# Patient Record
Sex: Male | Born: 1955 | Hispanic: No | Marital: Married | Smoking: Never smoker
Health system: Southern US, Community
[De-identification: ages and names within clinical notes are randomized; demographics above are authoritative.]

## PROBLEM LIST (undated history)

## (undated) DIAGNOSIS — E119 Type 2 diabetes mellitus without complications: Secondary | ICD-10-CM

## (undated) DIAGNOSIS — E785 Hyperlipidemia, unspecified: Secondary | ICD-10-CM

## (undated) DIAGNOSIS — I10 Essential (primary) hypertension: Secondary | ICD-10-CM

## (undated) DIAGNOSIS — I4892 Unspecified atrial flutter: Secondary | ICD-10-CM

## (undated) HISTORY — DX: Type 2 diabetes mellitus without complications: E11.9

## (undated) HISTORY — DX: Hyperlipidemia, unspecified: E78.5

## (undated) HISTORY — DX: Unspecified atrial flutter: I48.92

## (undated) HISTORY — PX: CATARACT EXTRACTION: SUR2

## (undated) HISTORY — DX: Essential (primary) hypertension: I10

---

## 2006-05-26 ENCOUNTER — Ambulatory Visit (HOSPITAL_BASED_OUTPATIENT_CLINIC_OR_DEPARTMENT_OTHER): Admission: RE | Admit: 2006-05-26 | Discharge: 2006-05-26 | Payer: Self-pay | Admitting: Urology

## 2012-04-28 ENCOUNTER — Encounter (HOSPITAL_COMMUNITY): Payer: Self-pay

## 2012-04-28 ENCOUNTER — Emergency Department (HOSPITAL_COMMUNITY)
Admission: EM | Admit: 2012-04-28 | Discharge: 2012-04-28 | Disposition: A | Discharge status: Home / Self Care | Payer: PRIVATE HEALTH INSURANCE | Attending: Emergency Medicine | Admitting: Emergency Medicine

## 2012-04-28 DIAGNOSIS — T63461A Toxic effect of venom of wasps, accidental (unintentional), initial encounter: Secondary | ICD-10-CM | POA: Insufficient documentation

## 2012-04-28 DIAGNOSIS — T6391XA Toxic effect of contact with unspecified venomous animal, accidental (unintentional), initial encounter: Secondary | ICD-10-CM | POA: Insufficient documentation

## 2012-04-28 DIAGNOSIS — T63441A Toxic effect of venom of bees, accidental (unintentional), initial encounter: Secondary | ICD-10-CM

## 2012-04-28 MED ORDER — DIPHENHYDRAMINE HCL 50 MG/ML IJ SOLN
25.0000 mg | Freq: Once | INTRAMUSCULAR | Status: AC
  Administered 2012-04-28: 25 mg via INTRAVENOUS
  Filled 2012-04-28: qty 1

## 2012-04-28 MED ORDER — PREDNISONE 10 MG PO TABS: 40.0000 mg | ORAL_TABLET | Freq: Every day | ORAL | Status: DC

## 2012-04-28 MED ORDER — EPINEPHRINE 0.3 MG/0.3ML IJ DEVI: 0.3000 mg | INTRAMUSCULAR | Status: DC | PRN

## 2012-04-28 MED ORDER — SODIUM CHLORIDE 0.9 % IV SOLN
Freq: Once | INTRAVENOUS | Status: AC
  Administered 2012-04-28: 17:00:00 via INTRAVENOUS

## 2012-04-28 MED ORDER — DEXAMETHASONE SODIUM PHOSPHATE 10 MG/ML IJ SOLN
10.0000 mg | Freq: Once | INTRAMUSCULAR | Status: AC
  Administered 2012-04-28: 10 mg via INTRAVENOUS
  Filled 2012-04-28: qty 1

## 2012-04-28 MED ORDER — FAMOTIDINE IN NACL 20-0.9 MG/50ML-% IV SOLN
20.0000 mg | Freq: Once | INTRAVENOUS | Status: AC
  Administered 2012-04-28: 20 mg via INTRAVENOUS
  Filled 2012-04-28: qty 50

## 2012-04-28 NOTE — ED Notes (Addendum)
In from home states was stung by 4 yellow jackets, presents with redness and swelling to the face, denies difficulty breathing airway intact, rash/welts to hands and legs

## 2012-04-28 NOTE — ED Provider Notes (Signed)
History     CSN: [id number]  Arrival date & time 04/28/12  1604   First MD Initiated Contact with Patient 04/28/12 1619      Chief Complaint  Patient presents with  . Insect Bite    (Consider location/radiation/quality/duration/timing/severity/associated sxs/prior treatment) HPI Comments: Patient here with red itchy rash after having gotten stung by 4 yellow jackets today.  Reports no prior history of reaction to bee stings.  States he has not taken any thing for this - denies fever, chills, throat swelling, tongue swelling, chest pain or shortness of breath.  Patient is a 56 y.o. male presenting with rash. The history is provided by the patient. No language interpreter was used.  Rash  This is a new problem. The current episode started 1 to 2 hours ago. The problem has been gradually worsening. The problem is associated with an insect bite/sting. There has been no fever. The rash is present on the face, lips, trunk, right arm and left arm. The pain is at a severity of 3/10. The pain is moderate. The pain has been constant since onset. Associated symptoms include itching and pain. Pertinent negatives include no blisters and no weeping. He has tried nothing for the symptoms. The treatment provided no relief.    History reviewed. No pertinent past medical history.  History reviewed. No pertinent past surgical history.  No family history on file.  History  Substance Use Topics  . Smoking status: Never Smoker   . Smokeless tobacco: Not on file  . Alcohol Use: No      Review of Systems  Constitutional: Negative for fever and chills.  HENT: Positive for facial swelling. Negative for neck pain.   Eyes: Negative for pain.  Respiratory: Negative for chest tightness and shortness of breath.   Cardiovascular: Negative for chest pain.  Gastrointestinal: Negative for nausea, vomiting and abdominal pain.  Genitourinary: Negative for flank pain.  Musculoskeletal: Negative for back pain.    Skin: Positive for itching and rash.  Neurological: Negative for headaches.  All other systems reviewed and are negative.    Allergies  Bee venom  Home Medications   Current Outpatient Rx  Name Route Sig Dispense Refill  . IBUPROFEN 200 MG PO TABS Oral Take 200 mg by mouth every 6 (six) hours as needed.      BP 125/68  Pulse 73  Temp 98.8 F (37.1 C) (Oral)  Resp 18  SpO2 96%  Physical Exam  Nursing note and vitals reviewed. Constitutional: He is oriented to person, place, and time. He appears well-developed and well-nourished. No distress.  HENT:  Head: Atraumatic.  Right Ear: External ear normal.  Left Ear: External ear normal.  Nose: Nose normal.  Mouth/Throat: Oropharynx is clear and moist. No oropharyngeal exudate.       Face with erythematous rash, mild swelling to lips. Oropharynx clear  Eyes: Conjunctivae are normal. Pupils are equal, round, and reactive to light. No scleral icterus.  Neck: Normal range of motion. Neck supple.  Cardiovascular: Normal rate, regular rhythm and normal heart sounds.  Exam reveals no gallop and no friction rub.   No murmur heard. Pulmonary/Chest: Effort normal and breath sounds normal. No respiratory distress. He has no wheezes. He has no rales. He exhibits no tenderness.  Abdominal: Soft. Bowel sounds are normal. He exhibits no distension. There is no tenderness.  Musculoskeletal: Normal range of motion. He exhibits no edema and no tenderness.  Lymphadenopathy:    He has no cervical adenopathy.  Neurological:  He is alert and oriented to person, place, and time. No cranial nerve deficit. He exhibits normal muscle tone. Coordination normal.  Skin: Skin is warm and dry. Rash noted. There is erythema.       Urticarial erythematous rash to face, neck, torso, bilateral arms and back.  Psychiatric: He has a normal mood and affect. His behavior is normal. Judgment and thought content normal.    ED Course  Procedures (including critical  care time)  Labs Reviewed - No data to display No results found.  Allergic reaction to bee sting    MDM  Patient here with allergic reaction to bee sting - erythema better, no evidence of airway compromise, will discharge home.        Izola Price [place], [place] 04/28/12 1859

## 2012-04-28 NOTE — ED Notes (Signed)
Bed:[facility]ALA<BR> Expected date:<BR> Expected time:<BR> Means of arrival:<BR> Comments:<BR>

## 2012-04-29 NOTE — ED Provider Notes (Signed)
Medical screening examination/treatment/procedure(s) were performed by non-physician practitioner and as supervising physician I was immediately available for consultation/collaboration.  Hurman Horn, MD 04/29/12 1236

## 2014-02-13 ENCOUNTER — Other Ambulatory Visit: Payer: Self-pay | Admitting: Family Medicine

## 2014-02-13 DIAGNOSIS — R0989 Other specified symptoms and signs involving the circulatory and respiratory systems: Secondary | ICD-10-CM

## 2014-02-14 ENCOUNTER — Ambulatory Visit
Admission: RE | Admit: 2014-02-14 | Discharge: 2014-02-14 | Disposition: A | Discharge status: Home / Self Care | Payer: 59 | Source: Ambulatory Visit | Attending: Family Medicine | Admitting: Family Medicine

## 2014-02-14 DIAGNOSIS — R0989 Other specified symptoms and signs involving the circulatory and respiratory systems: Secondary | ICD-10-CM

## 2019-03-20 DIAGNOSIS — E119 Type 2 diabetes mellitus without complications: Secondary | ICD-10-CM | POA: Diagnosis not present

## 2019-03-20 DIAGNOSIS — E7800 Pure hypercholesterolemia, unspecified: Secondary | ICD-10-CM | POA: Diagnosis not present

## 2019-06-28 DIAGNOSIS — Z23 Encounter for immunization: Secondary | ICD-10-CM | POA: Diagnosis not present

## 2019-06-28 DIAGNOSIS — E119 Type 2 diabetes mellitus without complications: Secondary | ICD-10-CM | POA: Diagnosis not present

## 2019-06-28 DIAGNOSIS — E7800 Pure hypercholesterolemia, unspecified: Secondary | ICD-10-CM | POA: Diagnosis not present

## 2019-10-07 DIAGNOSIS — Z0000 Encounter for general adult medical examination without abnormal findings: Secondary | ICD-10-CM | POA: Diagnosis not present

## 2020-01-25 DIAGNOSIS — R0981 Nasal congestion: Secondary | ICD-10-CM | POA: Diagnosis not present

## 2020-01-25 DIAGNOSIS — Z20822 Contact with and (suspected) exposure to covid-19: Secondary | ICD-10-CM | POA: Diagnosis not present

## 2020-01-25 DIAGNOSIS — J309 Allergic rhinitis, unspecified: Secondary | ICD-10-CM | POA: Diagnosis not present

## 2020-10-27 DIAGNOSIS — E7800 Pure hypercholesterolemia, unspecified: Secondary | ICD-10-CM | POA: Diagnosis not present

## 2020-10-27 DIAGNOSIS — Z79899 Other long term (current) drug therapy: Secondary | ICD-10-CM | POA: Diagnosis not present

## 2020-10-27 DIAGNOSIS — Z23 Encounter for immunization: Secondary | ICD-10-CM | POA: Diagnosis not present

## 2020-10-27 DIAGNOSIS — E119 Type 2 diabetes mellitus without complications: Secondary | ICD-10-CM | POA: Diagnosis not present

## 2020-10-27 DIAGNOSIS — Z125 Encounter for screening for malignant neoplasm of prostate: Secondary | ICD-10-CM | POA: Diagnosis not present

## 2020-10-27 DIAGNOSIS — Z0000 Encounter for general adult medical examination without abnormal findings: Secondary | ICD-10-CM | POA: Diagnosis not present

## 2020-11-30 DIAGNOSIS — E1169 Type 2 diabetes mellitus with other specified complication: Secondary | ICD-10-CM | POA: Diagnosis not present

## 2020-11-30 DIAGNOSIS — E7800 Pure hypercholesterolemia, unspecified: Secondary | ICD-10-CM | POA: Diagnosis not present

## 2020-11-30 DIAGNOSIS — I10 Essential (primary) hypertension: Secondary | ICD-10-CM | POA: Diagnosis not present

## 2021-11-01 ENCOUNTER — Other Ambulatory Visit (HOSPITAL_BASED_OUTPATIENT_CLINIC_OR_DEPARTMENT_OTHER): Payer: Self-pay | Admitting: Family Medicine

## 2021-11-01 DIAGNOSIS — M79652 Pain in left thigh: Secondary | ICD-10-CM

## 2021-11-02 ENCOUNTER — Other Ambulatory Visit: Payer: Self-pay

## 2021-11-02 ENCOUNTER — Ambulatory Visit (HOSPITAL_BASED_OUTPATIENT_CLINIC_OR_DEPARTMENT_OTHER)
Admission: RE | Admit: 2021-11-02 | Discharge: 2021-11-02 | Disposition: A | Discharge status: Home / Self Care | Payer: BC Managed Care – PPO | Source: Ambulatory Visit | Attending: Family Medicine | Admitting: Family Medicine

## 2021-11-02 DIAGNOSIS — M79652 Pain in left thigh: Secondary | ICD-10-CM | POA: Insufficient documentation

## 2021-11-09 ENCOUNTER — Other Ambulatory Visit: Payer: Self-pay

## 2021-11-09 ENCOUNTER — Encounter: Payer: Self-pay | Admitting: Cardiology

## 2021-11-09 ENCOUNTER — Ambulatory Visit: Payer: BC Managed Care – PPO | Admitting: Cardiology

## 2021-11-09 VITALS — BP 130/68 | HR 54 | Temp 97.6°F | Resp 16 | Ht 72.0 in | Wt 230.8 lb

## 2021-11-09 DIAGNOSIS — E1169 Type 2 diabetes mellitus with other specified complication: Secondary | ICD-10-CM

## 2021-11-09 DIAGNOSIS — E6609 Other obesity due to excess calories: Secondary | ICD-10-CM

## 2021-11-09 DIAGNOSIS — E1165 Type 2 diabetes mellitus with hyperglycemia: Secondary | ICD-10-CM

## 2021-11-09 DIAGNOSIS — Z6831 Body mass index (BMI) 31.0-31.9, adult: Secondary | ICD-10-CM

## 2021-11-09 DIAGNOSIS — I10 Essential (primary) hypertension: Secondary | ICD-10-CM

## 2021-11-09 DIAGNOSIS — I483 Typical atrial flutter: Secondary | ICD-10-CM

## 2021-11-09 DIAGNOSIS — Z7901 Long term (current) use of anticoagulants: Secondary | ICD-10-CM

## 2021-11-09 DIAGNOSIS — E785 Hyperlipidemia, unspecified: Secondary | ICD-10-CM

## 2021-11-09 NOTE — H&P (View-Only) (Signed)
Date:  11/09/2021   ID:  Jordan Sharp, DOB 02/15/1956, MRN [id number]  PCP:  Lujean Amel, MD  Cardiologist:  Rex Kras, DO, [facility] (established care 11/09/2021)  REASON FOR CONSULT: Atrial Flutter.   REQUESTING PHYSICIAN:  Lujean Amel, MD [place] [place],  [place] [id number]  Chief Complaint  Patient presents with   Atrial Flutter   New Patient (Initial Visit)    HPI  Jordan Sharp is a 66 y.o. Caucasian male who presents to the office with a chief complaint of "newly ." Patient's past medical history and cardiovascular risk factors include: Non-insulin-dependent diabetes mellitus type 2, hypertension, atrial flutter, hypercholesterolemia.   He is referred to the office at the request of Koirala, [place], MD for evaluation of Atrial Flutter.  Patient is accompanied by his wife Jordan Sharp at today's visit.  Approximately 5 weeks ago patient was in his normal state of health jogging every day approximately 5 to 6 miles.  After Thanksgiving he woke up 1 day with a charley horse and in the process injured his left thigh later he was informed that it was a muscle tear.  After coming back to town he followed up with his PCP for an annual visit which was scheduled for January 5/6th of 2023.  During that office visit he was diagnosed with atrial flutter.  He was started on metoprolol for rate control strategy and Eliquis for thromboembolic prophylaxis.  He is now referred to cardiology for further evaluation and management.  Patient states that this is the first time he was diagnosed with atrial flutter.  No prior history.  No history of intracranial or gastrointestinal bleeding.  No history of anemia.  No recent surgeries.  EKG performed at his PCPs office notes atrial flutter with variable conduction.  No family history of premature coronary disease or sudden cardiac death.  FUNCTIONAL STATUS: 5 weeks ago used to jog 5 to 6 miles per day.   Urine  ALLERGIES: Allergies  Allergen Reactions   Bee Venom Hives and Swelling    MEDICATION LIST PRIOR TO VISIT: Current Meds  Medication Sig   atorvastatin (LIPITOR) 10 MG tablet Take 10 mg by mouth daily.   Cholecalciferol (VITAMIN D3) 1.25 MG (50000 UT) CAPS Take by mouth.   ELIQUIS 5 MG TABS tablet Take 5 mg by mouth 2 (two) times daily.   EPINEPHrine (EPIPEN) 0.3 mg/0.3 mL DEVI Inject 0.3 mLs (0.3 mg total) into the muscle as needed.   Ginger, Zingiber officinalis, (GINGER ROOT PO) Take 540 mg by mouth daily at 12 noon.   glipiZIDE (GLUCOTROL XL) 5 MG 24 hr tablet Take 5 mg by mouth every morning.   losartan (COZAAR) 25 MG tablet Take 25 mg by mouth daily.   metFORMIN (GLUCOPHAGE-XR) 500 MG 24 hr tablet Take 500 mg by mouth daily.   Multiple Vitamins-Minerals (MULTIVITAMIN MEN PO) Take 1 tablet by mouth daily.   Omega-3 Fatty Acids (ULTRA OMEGA-3 FISH OIL) 1400 MG CAPS Take by mouth.   OZEMPIC, 0.25 OR 0.5 MG/DOSE, 2 MG/1.5ML SOPN Inject into the skin.   Probiotic Product (UP4 PROBIOTICS PO) Take 2 tablets by mouth 2 (two) times daily.     PAST MEDICAL HISTORY: Past Medical History:  Diagnosis Date   Atrial flutter ([place])    Diabetes ([place])    Hyperlipidemia    Hypertension     PAST SURGICAL HISTORY: History reviewed. No pertinent surgical history.  FAMILY HISTORY: The patient family history includes Diabetes in his brother  and mother. ° °SOCIAL HISTORY:  °The patient  reports that he has never smoked. He does not have any smokeless tobacco history on file. He reports current alcohol use of about 2.0 standard drinks per week. He reports that he does not use drugs. ° °REVIEW OF SYSTEMS: °Review of Systems  °Constitutional: Negative for chills and fever.  °HENT:  Negative for hoarse voice and nosebleeds.   °Eyes:  Negative for discharge, double vision and pain.  °Cardiovascular:  Negative for chest pain, claudication, dyspnea on exertion, leg swelling, near-syncope, orthopnea,  palpitations, paroxysmal nocturnal dyspnea and syncope.  °Respiratory:  Negative for hemoptysis and shortness of breath.   °Musculoskeletal:  Negative for muscle cramps and myalgias.  °     Left thigh pain.  °Gastrointestinal:  Negative for abdominal pain, constipation, diarrhea, hematemesis, hematochezia, melena, nausea and vomiting.  °Neurological:  Negative for dizziness and light-headedness.  ° °PHYSICAL EXAM: °Vitals with BMI 11/09/2021 11/09/2021 04/28/2012  °Height - 6' 0" -  °Weight - 230 lbs 13 oz -  °BMI - 31.3 -  °Systolic 130 136 148  °Diastolic 68 93 67  °Pulse 54 60 71  ° ° °CONSTITUTIONAL: Age-appropriate, well-developed and well-nourished. No acute distress.  °SKIN: Skin is warm and dry. No rash noted. No cyanosis. No pallor. No jaundice °HEAD: Normocephalic and atraumatic.  °EYES: No scleral icterus °MOUTH/THROAT: Moist oral membranes.  °NECK: No JVD present. No thyromegaly noted. No carotid bruits  °LYMPHATIC: No visible cervical adenopathy.  °CHEST Normal respiratory effort. No intercostal retractions  °LUNGS: Clear to auscultation bilaterally.  No stridor. No wheezes. No rales.  °CARDIOVASCULAR: Irregularly irregular, variable S1-S2 no murmurs rubs or gallops appreciated. °ABDOMINAL: Soft, nontender, nondistended, positive bowel sounds in all 4 quadrants no apparent ascites.  °EXTREMITIES: Trace bilateral peripheral edema, warm to touch, 2+ right lower extremity DP and PT pulses, 1+ left lower extremity DP and PT pulses.  Range of motion of the left leg reduced on physical examination. °HEMATOLOGIC: No significant bruising °NEUROLOGIC: Oriented to person, place, and time. Nonfocal. Normal muscle tone.  °PSYCHIATRIC: Normal mood and affect. Normal behavior. Cooperative ° °CARDIAC DATABASE: °EKG: °11/09/2021: Atrial flutter with variable conduction, 82 bpm, consider old inferior infarct.  ° °Echocardiogram: °No results found for this or any previous visit from the past 1095 days. °  ° °Stress  Testing: °No results found for this or any previous visit from the past 1095 days. ° ° °Heart Catheterization: °None ° °Lower extremity venous duplex °11/02/2021 °No evidence of deep venous thrombosis ° °LABORATORY DATA: °No flowsheet data found. ° °No flowsheet data found. ° °Lipid Panel  °No results found for: CHOL, TRIG, HDL, CHOLHDL, VLDL, LDLCALC, LDLDIRECT, LABVLDL ° °No components found for: NTPROBNP °No results for input(s): PROBNP in the last 8760 hours. °No results for input(s): TSH in the last 8760 hours. ° °BMP °No results for input(s): NA, K, CL, CO2, GLUCOSE, BUN, CREATININE, CALCIUM, GFRNONAA, GFRAA in the last 8760 hours. ° °HEMOGLOBIN A1C °No results found for: HGBA1C, MPG ° °External Labs: °Collected: 10/29/2021. °A1c 8.7 °Hemoglobin 15.6 g/dL, hematocrit 45.9% °BUN 17, creatinine 1.05 mg/dL °Sodium 137, potassium 4.7, chloride 99, bicarb 23, °AST 13, ALT 17, alkaline phosphatase 56 °Total cholesterol 186, triglycerides 130, HDL 54, LDL 109, non-HDL 132 ° °IMPRESSION: ° °  ICD-10-CM   °1. Typical atrial flutter (HCC)  I48.3 EKG 12-Lead  °  °2. Long term (current) use of anticoagulants  Z79.01   °  °3. Type 2 diabetes mellitus with hyperglycemia, without   long-term current use of insulin (HCC)  E11.65     4. Type 2 diabetes mellitus with hyperlipidemia (HCC)  E11.69    E78.5     5. Benign hypertension  I10     6. Class 1 obesity due to excess calories with serious comorbidity and body mass index (BMI) of 31.0 to 31.9 in adult  E66.09    Z68.31        RECOMMENDATIONS: Jordan Sharp is a 66 y.o. Caucasian male whose past medical history and cardiac risk factors include: Non-insulin-dependent diabetes mellitus type 2, hypertension, atrial flutter, hypercholesterolemia.   Typical atrial flutter (HCC) Newly discovered, incidentally by primary team Already on metoprolol for rate control strategy and oral anticoagulation for thromboembolic prophylaxis. Recommend either cardioversion or  cardiac electrophysiology consultation for atrial flutter ablation. With regards to cardioversion since he has not been oral anticoagulated for 4 weeks and the duration of atrial flutter is unknown therefore recommend TEE guided cardioversion.  Otherwise would recommend at least 4 weeks of oral anticoagulation uninterrupted prior to considering just direct-current cardioversion (earliest November 29, 2021). The risks, benefits, and alternatives to both transesophageal guided cardioversion and cardioversion alone discussed with the patient and wife at length at today's office visit. For now they would like to hold off on any cardiovascular procedures at this time. They will call the office back regarding their decision. Both patient and wife are educated that if he develops lightheadedness, dizziness, near-syncope or syncopal events or has any rapid ventricular rate he should go to the ED for further evaluation and management. Educated on importance of risk factor modifications: Blood pressure/lipid/diabetes management.   Recommend referral to sleep medicine for sleep apnea-will defer to primary team at this time. Encouraged the importance of increasing physical activity as tolerated with a goal of 30 minutes a day 5 days a week.  Long-term oral anticoagulation: Indication: Atrial flutter CHA2DS2-VASc SCORE is 3 which correlates to 3.2 % risk of stroke per year (DM, HTN, age). Reemphasized the risks, benefits, and alternatives to oral anticoagulation.   We also discussed different anticoagulant candidates such as Coumadin versus direct oral anticoagulants and medication profile.  Shared decision was to continue with Eliquis for now.  Type 2 diabetes mellitus with hyperglycemia, without long-term current use of insulin (HCC) Hemoglobin A1c 8.7 as of October 29, 2021 Currently managed by primary team Reemphasized importance of glycemic control Continue statin with ACEs, ARB.  Type 2 diabetes  mellitus with hyperlipidemia (HCC) Currently on atorvastatin.   He denies myalgia or other side effects. Most recent lipids dated 10/29/2021 reviewed as noted above.  LDL currently not at goal. Currently managed by primary care provider.  Benign hypertension Office blood pressures within acceptable range. Medications reconciled. Reemphasized the importance of low-salt diet.  Class 1 obesity due to excess calories with serious comorbidity and body mass index (BMI) of 31.0 to 31.9 in adult Body mass index is 31.3 kg/m. I reviewed with the patient the importance of diet, regular physical activity/exercise, weight loss.   Patient is educated on increasing physical activity gradually as tolerated.  With the goal of moderate intensity exercise for 30 minutes a day 5 days a week.  FINAL MEDICATION LIST END OF ENCOUNTER: No orders of the defined types were placed in this encounter.   Medications Discontinued During This Encounter  Medication Reason   ibuprofen (ADVIL,MOTRIN) 200 MG tablet    predniSONE (DELTASONE) 10 MG tablet      Current Outpatient Medications:    atorvastatin (  LIPITOR) 10 MG tablet, Take 10 mg by mouth daily., Disp: , Rfl:    Cholecalciferol (VITAMIN D3) 1.25 MG (50000 UT) CAPS, Take by mouth., Disp: , Rfl:    ELIQUIS 5 MG TABS tablet, Take 5 mg by mouth 2 (two) times daily., Disp: , Rfl:    EPINEPHrine (EPIPEN) 0.3 mg/0.3 mL DEVI, Inject 0.3 mLs (0.3 mg total) into the muscle as needed., Disp: 1 Device, Rfl: 0   Ginger, Zingiber officinalis, (GINGER ROOT PO), Take 540 mg by mouth daily at 12 noon., Disp: , Rfl:    glipiZIDE (GLUCOTROL XL) 5 MG 24 hr tablet, Take 5 mg by mouth every morning., Disp: , Rfl:    losartan (COZAAR) 25 MG tablet, Take 25 mg by mouth daily., Disp: , Rfl:    metFORMIN (GLUCOPHAGE-XR) 500 MG 24 hr tablet, Take 500 mg by mouth daily., Disp: , Rfl:    Multiple Vitamins-Minerals (MULTIVITAMIN MEN PO), Take 1 tablet by mouth daily., Disp: , Rfl:     Omega-3 Fatty Acids (ULTRA OMEGA-3 FISH OIL) 1400 MG CAPS, Take by mouth., Disp: , Rfl:    OZEMPIC, 0.25 OR 0.5 MG/DOSE, 2 MG/1.5ML SOPN, Inject into the skin., Disp: , Rfl:    Probiotic Product (UP4 PROBIOTICS PO), Take 2 tablets by mouth 2 (two) times daily., Disp: , Rfl:   Orders Placed This Encounter  Procedures   EKG 12-Lead    There are no Patient Instructions on file for this visit.   --Continue cardiac medications as reconciled in final medication list. --Return in about 4 weeks (around 12/07/2021) for Follow up, Atrial flutter. Or sooner if needed. --Continue follow-up with your primary care physician regarding the management of your other chronic comorbid conditions.  Patient's questions and concerns were addressed to his satisfaction. He voices understanding of the instructions provided during this encounter.   This note was created using a voice recognition software as a result there may be grammatical errors inadvertently enclosed that do not reflect the nature of this encounter. Every attempt is made to correct such errors  Total time spent: 60 minutes as part of this initial consultation reviewed outside records provided by PCP which included office note, EKG, labs these findings have been summarized and noted above for further reference.  Discussed disease management, we emphasized risks/benefits/alternatives to oral anticoagulation, discussed modalities to consider to restore normal sinus rhythm,ordering diagnostic testing, coordination of care and patient education provided as a part of today's encounter.  Rex Kras, [place], [facility]  Pager: [phone] Office: [phone]

## 2021-11-09 NOTE — Progress Notes (Signed)
Date:  11/09/2021   ID:  Jordan Sharp, DOB Jan 30, 1956, MRN [id number]  PCP:  Lujean Amel, MD  Cardiologist:  Rex Kras, DO, [facility] (established care 11/09/2021)  REASON FOR CONSULT: Atrial Flutter.   REQUESTING PHYSICIAN:  Lujean Amel, MD [address],  [place] [address]  Chief Complaint  Patient presents with   Atrial Flutter   New Patient (Initial Visit)    HPI  Jordan Sharp is a 66 y.o. Caucasian male who presents to the office with a chief complaint of "newly ." Patient's past medical history and cardiovascular risk factors include: Non-insulin-dependent diabetes mellitus type 2, hypertension, atrial flutter, hypercholesterolemia.   He is referred to the office at the request of Koirala, Dibas, MD for evaluation of Atrial Flutter.  Patient is accompanied by his wife Beverlee Nims at today's visit.  Approximately 5 weeks ago patient was in his normal state of health jogging every day approximately 5 to 6 miles.  After Thanksgiving he woke up 1 day with a charley horse and in the process injured his left thigh later he was informed that it was a muscle tear.  After coming back to town he followed up with his PCP for an annual visit which was scheduled for January 5/6th of 2023.  During that office visit he was diagnosed with atrial flutter.  He was started on metoprolol for rate control strategy and Eliquis for thromboembolic prophylaxis.  He is now referred to cardiology for further evaluation and management.  Patient states that this is the first time he was diagnosed with atrial flutter.  No prior history.  No history of intracranial or gastrointestinal bleeding.  No history of anemia.  No recent surgeries.  EKG performed at his PCPs office notes atrial flutter with variable conduction.  No family history of premature coronary disease or sudden cardiac death.  FUNCTIONAL STATUS: 5 weeks ago used to jog 5 to 6 miles per day.   Urine  ALLERGIES: Allergies  Allergen Reactions   Bee Venom Hives and Swelling    MEDICATION LIST PRIOR TO VISIT: Current Meds  Medication Sig   atorvastatin (LIPITOR) 10 MG tablet Take 10 mg by mouth daily.   Cholecalciferol (VITAMIN D3) 1.25 MG (50000 UT) CAPS Take by mouth.   ELIQUIS 5 MG TABS tablet Take 5 mg by mouth 2 (two) times daily.   EPINEPHrine (EPIPEN) 0.3 mg/0.3 mL DEVI Inject 0.3 mLs (0.3 mg total) into the muscle as needed.   Ginger, Zingiber officinalis, (GINGER ROOT PO) Take 540 mg by mouth daily at 12 noon.   glipiZIDE (GLUCOTROL XL) 5 MG 24 hr tablet Take 5 mg by mouth every morning.   losartan (COZAAR) 25 MG tablet Take 25 mg by mouth daily.   metFORMIN (GLUCOPHAGE-XR) 500 MG 24 hr tablet Take 500 mg by mouth daily.   Multiple Vitamins-Minerals (MULTIVITAMIN MEN PO) Take 1 tablet by mouth daily.   Omega-3 Fatty Acids (ULTRA OMEGA-3 FISH OIL) 1400 MG CAPS Take by mouth.   OZEMPIC, 0.25 OR 0.5 MG/DOSE, 2 MG/1.5ML SOPN Inject into the skin.   Probiotic Product (UP4 PROBIOTICS PO) Take 2 tablets by mouth 2 (two) times daily.     PAST MEDICAL HISTORY: Past Medical History:  Diagnosis Date   Atrial flutter ([place])    Diabetes ([place])    Hyperlipidemia    Hypertension     PAST SURGICAL HISTORY: History reviewed. No pertinent surgical history.  FAMILY HISTORY: The patient family history includes Diabetes in his brother  and mother.  SOCIAL HISTORY:  The patient  reports that he has never smoked. He does not have any smokeless tobacco history on file. He reports current alcohol use of about 2.0 standard drinks per week. He reports that he does not use drugs.  REVIEW OF SYSTEMS: Review of Systems  Constitutional: Negative for chills and fever.  HENT:  Negative for hoarse voice and nosebleeds.   Eyes:  Negative for discharge, double vision and pain.  Cardiovascular:  Negative for chest pain, claudication, dyspnea on exertion, leg swelling, near-syncope, orthopnea,  palpitations, paroxysmal nocturnal dyspnea and syncope.  Respiratory:  Negative for hemoptysis and shortness of breath.   Musculoskeletal:  Negative for muscle cramps and myalgias.       Left thigh pain.  Gastrointestinal:  Negative for abdominal pain, constipation, diarrhea, hematemesis, hematochezia, melena, nausea and vomiting.  Neurological:  Negative for dizziness and light-headedness.   PHYSICAL EXAM: Vitals with BMI 11/09/2021 11/09/2021 04/28/2012  Height - 6\' 0"  -  Weight - 230 lbs 13 oz -  BMI - 123456 -  Systolic [id number]  Diastolic 68 93 67  Pulse 54 60 71    CONSTITUTIONAL: Age-appropriate, well-developed and well-nourished. No acute distress.  SKIN: Skin is warm and dry. No rash noted. No cyanosis. No pallor. No jaundice HEAD: Normocephalic and atraumatic.  EYES: No scleral icterus MOUTH/THROAT: Moist oral membranes.  NECK: No JVD present. No thyromegaly noted. No carotid bruits  LYMPHATIC: No visible cervical adenopathy.  CHEST Normal respiratory effort. No intercostal retractions  LUNGS: Clear to auscultation bilaterally.  No stridor. No wheezes. No rales.  CARDIOVASCULAR: Irregularly irregular, variable S1-S2 no murmurs rubs or gallops appreciated. ABDOMINAL: Soft, nontender, nondistended, positive bowel sounds in all 4 quadrants no apparent ascites.  EXTREMITIES: Trace bilateral peripheral edema, warm to touch, 2+ right lower extremity DP and PT pulses, 1+ left lower extremity DP and PT pulses.  Range of motion of the left leg reduced on physical examination. HEMATOLOGIC: No significant bruising NEUROLOGIC: Oriented to person, place, and time. Nonfocal. Normal muscle tone.  PSYCHIATRIC: Normal mood and affect. Normal behavior. Cooperative  CARDIAC DATABASE: EKG: 11/09/2021: Atrial flutter with variable conduction, 82 bpm, consider old inferior infarct.   Echocardiogram: No results found for this or any previous visit from the past 1095 days.    Stress  Testing: No results found for this or any previous visit from the past 1095 days.   Heart Catheterization: None  Lower extremity venous duplex 11/02/2021 No evidence of deep venous thrombosis  LABORATORY DATA: No flowsheet data found.  No flowsheet data found.  Lipid Panel  No results found for: CHOL, TRIG, HDL, CHOLHDL, VLDL, LDLCALC, LDLDIRECT, LABVLDL  No components found for: NTPROBNP No results for input(s): PROBNP in the last 8760 hours. No results for input(s): TSH in the last 8760 hours.  BMP No results for input(s): NA, K, CL, CO2, GLUCOSE, BUN, CREATININE, CALCIUM, GFRNONAA, GFRAA in the last 8760 hours.  HEMOGLOBIN A1C No results found for: HGBA1C, MPG  External Labs: Collected: 10/29/2021. A1c 8.7 Hemoglobin 15.6 g/dL, hematocrit 45.9% BUN 17, creatinine 1.05 mg/dL Sodium 137, potassium 4.7, chloride 99, bicarb 23, AST 13, ALT 17, alkaline phosphatase 56 Total cholesterol 186, triglycerides 130, HDL 54, LDL 109, non-HDL 132  IMPRESSION:    ICD-10-CM   1. Typical atrial flutter (HCC)  I48.3 EKG 12-Lead    2. Long term (current) use of anticoagulants  Z79.01     3. Type 2 diabetes mellitus with hyperglycemia, without  long-term current use of insulin (HCC)  E11.65     4. Type 2 diabetes mellitus with hyperlipidemia (HCC)  E11.69    E78.5     5. Benign hypertension  I10     6. Class 1 obesity due to excess calories with serious comorbidity and body mass index (BMI) of 31.0 to 31.9 in adult  E66.09    Z68.31        RECOMMENDATIONS: Muhanad Bjornson is a 66 y.o. Caucasian male whose past medical history and cardiac risk factors include: Non-insulin-dependent diabetes mellitus type 2, hypertension, atrial flutter, hypercholesterolemia.   Typical atrial flutter (HCC) Newly discovered, incidentally by primary team Already on metoprolol for rate control strategy and oral anticoagulation for thromboembolic prophylaxis. Recommend either cardioversion or  cardiac electrophysiology consultation for atrial flutter ablation. With regards to cardioversion since he has not been oral anticoagulated for 4 weeks and the duration of atrial flutter is unknown therefore recommend TEE guided cardioversion.  Otherwise would recommend at least 4 weeks of oral anticoagulation uninterrupted prior to considering just direct-current cardioversion (earliest November 29, 2021). The risks, benefits, and alternatives to both transesophageal guided cardioversion and cardioversion alone discussed with the patient and wife at length at today's office visit. For now they would like to hold off on any cardiovascular procedures at this time. They will call the office back regarding their decision. Both patient and wife are educated that if he develops lightheadedness, dizziness, near-syncope or syncopal events or has any rapid ventricular rate he should go to the ED for further evaluation and management. Educated on importance of risk factor modifications: Blood pressure/lipid/diabetes management.   Recommend referral to sleep medicine for sleep apnea-will defer to primary team at this time. Encouraged the importance of increasing physical activity as tolerated with a goal of 30 minutes a day 5 days a week.  Long-term oral anticoagulation: Indication: Atrial flutter CHA2DS2-VASc SCORE is 3 which correlates to 3.2 % risk of stroke per year (DM, HTN, age). Reemphasized the risks, benefits, and alternatives to oral anticoagulation.   We also discussed different anticoagulant candidates such as Coumadin versus direct oral anticoagulants and medication profile.  Shared decision was to continue with Eliquis for now.  Type 2 diabetes mellitus with hyperglycemia, without long-term current use of insulin (HCC) Hemoglobin A1c 8.7 as of October 29, 2021 Currently managed by primary team Reemphasized importance of glycemic control Continue statin with ACEs, ARB.  Type 2 diabetes  mellitus with hyperlipidemia (HCC) Currently on atorvastatin.   He denies myalgia or other side effects. Most recent lipids dated 10/29/2021 reviewed as noted above.  LDL currently not at goal. Currently managed by primary care provider.  Benign hypertension Office blood pressures within acceptable range. Medications reconciled. Reemphasized the importance of low-salt diet.  Class 1 obesity due to excess calories with serious comorbidity and body mass index (BMI) of 31.0 to 31.9 in adult Body mass index is 31.3 kg/m. I reviewed with the patient the importance of diet, regular physical activity/exercise, weight loss.   Patient is educated on increasing physical activity gradually as tolerated.  With the goal of moderate intensity exercise for 30 minutes a day 5 days a week.  FINAL MEDICATION LIST END OF ENCOUNTER: No orders of the defined types were placed in this encounter.   Medications Discontinued During This Encounter  Medication Reason   ibuprofen (ADVIL,MOTRIN) 200 MG tablet    predniSONE (DELTASONE) 10 MG tablet      Current Outpatient Medications:    atorvastatin (  LIPITOR) 10 MG tablet, Take 10 mg by mouth daily., Disp: , Rfl:    Cholecalciferol (VITAMIN D3) 1.25 MG (50000 UT) CAPS, Take by mouth., Disp: , Rfl:    ELIQUIS 5 MG TABS tablet, Take 5 mg by mouth 2 (two) times daily., Disp: , Rfl:    EPINEPHrine (EPIPEN) 0.3 mg/0.3 mL DEVI, Inject 0.3 mLs (0.3 mg total) into the muscle as needed., Disp: 1 Device, Rfl: 0   Ginger, Zingiber officinalis, (GINGER ROOT PO), Take 540 mg by mouth daily at 12 noon., Disp: , Rfl:    glipiZIDE (GLUCOTROL XL) 5 MG 24 hr tablet, Take 5 mg by mouth every morning., Disp: , Rfl:    losartan (COZAAR) 25 MG tablet, Take 25 mg by mouth daily., Disp: , Rfl:    metFORMIN (GLUCOPHAGE-XR) 500 MG 24 hr tablet, Take 500 mg by mouth daily., Disp: , Rfl:    Multiple Vitamins-Minerals (MULTIVITAMIN MEN PO), Take 1 tablet by mouth daily., Disp: , Rfl:     Omega-3 Fatty Acids (ULTRA OMEGA-3 FISH OIL) 1400 MG CAPS, Take by mouth., Disp: , Rfl:    OZEMPIC, 0.25 OR 0.5 MG/DOSE, 2 MG/1.5ML SOPN, Inject into the skin., Disp: , Rfl:    Probiotic Product (UP4 PROBIOTICS PO), Take 2 tablets by mouth 2 (two) times daily., Disp: , Rfl:   Orders Placed This Encounter  Procedures   EKG 12-Lead    There are no Patient Instructions on file for this visit.   --Continue cardiac medications as reconciled in final medication list. --Return in about 4 weeks (around 12/07/2021) for Follow up, Atrial flutter. Or sooner if needed. --Continue follow-up with your primary care physician regarding the management of your other chronic comorbid conditions.  Patient's questions and concerns were addressed to his satisfaction. He voices understanding of the instructions provided during this encounter.   This note was created using a voice recognition software as a result there may be grammatical errors inadvertently enclosed that do not reflect the nature of this encounter. Every attempt is made to correct such errors  Total time spent: 60 minutes as part of this initial consultation reviewed outside records provided by PCP which included office note, EKG, labs these findings have been summarized and noted above for further reference.  Discussed disease management, we emphasized risks/benefits/alternatives to oral anticoagulation, discussed modalities to consider to restore normal sinus rhythm,ordering diagnostic testing, coordination of care and patient education provided as a part of today's encounter.  Rex Kras, [place], [facility]  Pager: [phone] Office: [phone]

## 2021-11-15 ENCOUNTER — Ambulatory Visit: Payer: BC Managed Care – PPO | Admitting: Cardiology

## 2021-11-16 ENCOUNTER — Other Ambulatory Visit: Payer: Self-pay | Admitting: Cardiology

## 2021-11-16 DIAGNOSIS — Z7901 Long term (current) use of anticoagulants: Secondary | ICD-10-CM

## 2021-11-16 DIAGNOSIS — I483 Typical atrial flutter: Secondary | ICD-10-CM

## 2021-11-16 NOTE — Progress Notes (Signed)
Spoke to the patient on the phone.  Patient is agreeable to proceed with transesophageal guided direct-current cardioversion  His questions and concerns addressed to his satisfaction.  Will check CBC and BMP prior to his procedure.  Tessa Lerner, [place], [facility]  Pager: [phone] Office: [phone]

## 2021-11-22 ENCOUNTER — Encounter (HOSPITAL_COMMUNITY): Payer: Self-pay | Admitting: Cardiology

## 2021-11-28 NOTE — Progress Notes (Signed)
External Labs: Collected: 10/29/2021 provided by PCP A1c 8.7. Hemoglobin 15.6 g/dL, hematocrit 45.9% BUN 17, creatinine 1.05 mg/dL. Sodium 137, potassium 4.7, chloride 99, bicarb 23. AST 13, ALT 17, alkaline phosphatase 56 Total cholesterol 196, triglycerides 130, HDL 54, LDL 109 non-HDL 132

## 2021-11-29 LAB — CBC
Hematocrit: 43.3 % (ref 37.5–51.0)
Hemoglobin: 15 g/dL (ref 13.0–17.7)
MCH: 30.1 pg (ref 26.6–33.0)
MCHC: 34.6 g/dL (ref 31.5–35.7)
MCV: 87 fL (ref 79–97)
Platelets: 228 10*3/uL (ref 150–450)
RBC: 4.99 x10E6/uL (ref 4.14–5.80)
RDW: 13 % (ref 11.6–15.4)
WBC: 5.4 10*3/uL (ref 3.4–10.8)

## 2021-11-29 NOTE — Anesthesia Preprocedure Evaluation (Addendum)
Anesthesia Evaluation  Patient identified by MRN, date of birth, ID band Patient awake    Reviewed: Allergy & Precautions, NPO status , Patient's Chart, lab work & pertinent test results  History of Anesthesia Complications Negative for: history of anesthetic complications  Airway Mallampati: II  TM Distance: >3 FB Neck ROM: Full    Dental no notable dental hx. (+) Dental Advisory Given   Pulmonary neg pulmonary ROS,    Pulmonary exam normal        Cardiovascular hypertension, Pt. on home beta blockers Normal cardiovascular exam+ dysrhythmias Atrial Fibrillation      Neuro/Psych negative neurological ROS     GI/Hepatic negative GI ROS, Neg liver ROS,   Endo/Other  diabetes  Renal/GU negative Renal ROS     Musculoskeletal negative musculoskeletal ROS (+)   Abdominal   Peds  Hematology negative hematology ROS (+)   Anesthesia Other Findings   Reproductive/Obstetrics                            Anesthesia Physical Anesthesia Plan  ASA: 3  Anesthesia Plan: General   Post-op Pain Management:    Induction:   PONV Risk Score and Plan: 2  Airway Management Planned: Mask  Additional Equipment:   Intra-op Plan:   Post-operative Plan:   Informed Consent: I have reviewed the patients History and Physical, chart, labs and discussed the procedure including the risks, benefits and alternatives for the proposed anesthesia with the patient or authorized representative who has indicated his/her understanding and acceptance.     Dental advisory given  Plan Discussed with: Anesthesiologist and CRNA  Anesthesia Plan Comments:        Anesthesia Quick Evaluation

## 2021-11-30 ENCOUNTER — Ambulatory Visit (HOSPITAL_COMMUNITY): Payer: BC Managed Care – PPO | Admitting: Anesthesiology

## 2021-11-30 ENCOUNTER — Ambulatory Visit (HOSPITAL_COMMUNITY): Payer: BC Managed Care – PPO

## 2021-11-30 ENCOUNTER — Encounter (HOSPITAL_COMMUNITY)
Admission: RE | Disposition: A | Discharge status: Home / Self Care | Payer: Self-pay | Source: Home / Self Care | Attending: Cardiology

## 2021-11-30 ENCOUNTER — Other Ambulatory Visit: Payer: Self-pay

## 2021-11-30 ENCOUNTER — Ambulatory Visit (HOSPITAL_COMMUNITY)
Admission: RE | Admit: 2021-11-30 | Discharge: 2021-11-30 | Disposition: A | Discharge status: Home / Self Care | Payer: BC Managed Care – PPO | Attending: Cardiology | Admitting: Cardiology

## 2021-11-30 ENCOUNTER — Encounter (HOSPITAL_COMMUNITY): Payer: Self-pay | Admitting: Cardiology

## 2021-11-30 DIAGNOSIS — I483 Typical atrial flutter: Secondary | ICD-10-CM | POA: Diagnosis not present

## 2021-11-30 DIAGNOSIS — E785 Hyperlipidemia, unspecified: Secondary | ICD-10-CM | POA: Diagnosis not present

## 2021-11-30 DIAGNOSIS — Z7985 Long-term (current) use of injectable non-insulin antidiabetic drugs: Secondary | ICD-10-CM | POA: Diagnosis not present

## 2021-11-30 DIAGNOSIS — Z7901 Long term (current) use of anticoagulants: Secondary | ICD-10-CM | POA: Diagnosis not present

## 2021-11-30 DIAGNOSIS — E1169 Type 2 diabetes mellitus with other specified complication: Secondary | ICD-10-CM | POA: Diagnosis not present

## 2021-11-30 DIAGNOSIS — Z6831 Body mass index (BMI) 31.0-31.9, adult: Secondary | ICD-10-CM | POA: Diagnosis not present

## 2021-11-30 DIAGNOSIS — Z7984 Long term (current) use of oral hypoglycemic drugs: Secondary | ICD-10-CM | POA: Insufficient documentation

## 2021-11-30 DIAGNOSIS — E6609 Other obesity due to excess calories: Secondary | ICD-10-CM | POA: Diagnosis not present

## 2021-11-30 DIAGNOSIS — I3139 Other pericardial effusion (noninflammatory): Secondary | ICD-10-CM | POA: Insufficient documentation

## 2021-11-30 DIAGNOSIS — I10 Essential (primary) hypertension: Secondary | ICD-10-CM | POA: Diagnosis not present

## 2021-11-30 HISTORY — PX: CARDIOVERSION: SHX1299

## 2021-11-30 HISTORY — PX: TEE WITHOUT CARDIOVERSION: SHX5443

## 2021-11-30 HISTORY — PX: BUBBLE STUDY: SHX6837

## 2021-11-30 LAB — BASIC METABOLIC PANEL
BUN/Creatinine Ratio: 13 (ref 10–24)
BUN: 15 mg/dL (ref 8–27)
CO2: 21 mmol/L (ref 20–29)
Calcium: 9.3 mg/dL (ref 8.6–10.2)
Chloride: 104 mmol/L (ref 96–106)
Creatinine, Ser: 1.16 mg/dL (ref 0.76–1.27)
Glucose: 147 mg/dL — ABNORMAL HIGH (ref 70–99)
Potassium: 4.3 mmol/L (ref 3.5–5.2)
Sodium: 142 mmol/L (ref 134–144)
eGFR: 69 mL/min/{1.73_m2} (ref >59)

## 2021-11-30 LAB — GLUCOSE, CAPILLARY: Glucose-Capillary: 104 mg/dL — ABNORMAL HIGH (ref 70–99)

## 2021-11-30 SURGERY — ECHOCARDIOGRAM, TRANSESOPHAGEAL
Anesthesia: General

## 2021-11-30 MED ORDER — SODIUM CHLORIDE 0.9 % IV SOLN: INTRAVENOUS | Status: DC

## 2021-11-30 MED ORDER — PROPOFOL 500 MG/50ML IV EMUL
INTRAVENOUS | Status: DC | PRN
  Administered 2021-11-30: 125 ug/kg/min via INTRAVENOUS

## 2021-11-30 MED ORDER — METOPROLOL TARTRATE 50 MG PO TABS
50.0000 mg | ORAL_TABLET | ORAL | Status: AC
  Administered 2021-11-30: 50 mg via ORAL
  Filled 2021-11-30: qty 1

## 2021-11-30 MED ORDER — LIDOCAINE 2% (20 MG/ML) 5 ML SYRINGE
INTRAMUSCULAR | Status: DC | PRN
Start: 2021-11-30 — End: 2021-11-30
  Administered 2021-11-30: 100 mg via INTRAVENOUS

## 2021-11-30 MED ORDER — METOPROLOL TARTRATE 50 MG PO TABS: 50.0000 mg | ORAL_TABLET | Freq: Two times a day (BID) | ORAL | 0 refills | Status: DC

## 2021-11-30 MED ORDER — ESMOLOL HCL 100 MG/10ML IV SOLN
INTRAVENOUS | Status: DC | PRN
  Administered 2021-11-30: 40 mg via INTRAVENOUS
  Administered 2021-11-30 (×2): 30 mg via INTRAVENOUS

## 2021-11-30 MED ORDER — PROPOFOL 10 MG/ML IV BOLUS
INTRAVENOUS | Status: DC | PRN
Start: 2021-11-30 — End: 2021-11-30
  Administered 2021-11-30: 40 mg via INTRAVENOUS
  Administered 2021-11-30: 20 mg via INTRAVENOUS

## 2021-11-30 NOTE — CV Procedure (Signed)
Transesophageal echocardiogram (TEE) : Preliminary report  Sedation: See anesthesia records.   TEE was performed without complications   LV: Normal EF. RV: Normal structure and function. LA: Grossly normal.  Spontaneous echo contrast was present.  No thrombus present. Left atrial appendage: Spontaneous echo contrast was not present.  No thrombus present. Inter atrial septum is intact without defect. Double contrast study negative for atrial level shunting. RA: Grossly normal.   MV: mild regurgitation,no stenosis, no vegetation noted.  TV: mild regurgitation,no stenosis, no vegetation noted. AV: no regurgitation,no stenosis, no vegetation noted.   PV: no regurgitation,no stenosis, no vegetation noted.   Thoracic and ascending aorta: Mild plaque.   Final report forthcoming.  Delilah Shan [facility]  Pager: [phone] Office: [phone]  Direct current cardioversion:  Indication symptomatic: Symptomatic atrial flutter.  Procedure Details:  Consent: Risks of procedure as well as the alternatives and risks of each were explained to the (patient/caregiver).  Consent for procedure obtained.  Time Out: Verified patient identification, verified procedure, site/side was marked, verified correct patient position, special equipment/implants available, medications/allergies/relevent history reviewed, required imaging and test results available. PERFORMED.  Patient placed on cardiac monitor, pulse oximetry, supplemental oxygen as necessary.  Sedation given:  see anesthesia records. Pacer pads placed anterior and posterior chest.  Cardioverted 1 time(s).  Cardioversion with synchronized biphasic 150J shock.  Evaluation: Findings: Post procedure EKG shows:  Sinus Tachycardia Complications: None Patient did tolerate procedure well.  Wife updated after the procedure.  Increase home dose of lopressor 50mg  po bid w/ holding parameters.  Outpatient follow up 12/16/2021  12/18/2021 [facility]  Pager: [phone] Office: [phone] 11/30/2021, 8:15 AM

## 2021-11-30 NOTE — Anesthesia Postprocedure Evaluation (Addendum)
Anesthesia Post Note  Patient: Jordan Sharp  Procedure(s) Performed: TRANSESOPHAGEAL ECHOCARDIOGRAM (TEE) CARDIOVERSION BUBBLE STUDY     Patient location during evaluation: Endoscopy Anesthesia Type: MAC Level of consciousness: awake and alert Pain management: pain level controlled Vital Signs Assessment: post-procedure vital signs reviewed and stable Respiratory status: spontaneous breathing and respiratory function stable Cardiovascular status: stable Postop Assessment: no apparent nausea or vomiting Anesthetic complications: no   No notable events documented.  Last Vitals:  Vitals:   11/30/21 0820 11/30/21 0836  BP:  119/83  Pulse:  100  Resp:  15  Temp: 36.7 C   SpO2:  99%    Last Pain:  Vitals:   11/30/21 0820  TempSrc:   PainSc: 0-No pain                 Levii Hairfield DANIEL

## 2021-11-30 NOTE — Discharge Instructions (Addendum)
Jordan Sharp has taken his initial dose for today of Metoprolol / Lopressor 50mg  while in PACU ---- do not repeat!

## 2021-11-30 NOTE — Addendum Note (Signed)
Addendum  created 11/30/21 1216 by Jeromiah Ohalloran, MD  ° Clinical Note Signed  °  °

## 2021-11-30 NOTE — Interval H&P Note (Signed)
History and Physical Interval Note:  11/30/2021 7:28 AM  Jordan Sharp  has presented today for surgery, with the diagnosis of AFLUTTER.  The various methods of treatment have been discussed with the patient and family. After consideration of risks, benefits and other options for treatment, the patient has consented to  Procedure(s): TRANSESOPHAGEAL ECHOCARDIOGRAM (TEE) (N/A) CARDIOVERSION (N/A) as a surgical intervention.  The patient's history has been reviewed, patient examined, no change in status, stable for surgery.  I have reviewed the patient's chart and labs.  Questions were answered to the patient's satisfaction.     After careful review of history and examination, the risks, benefits of transesophageal echocardiogram, and alternatives have been explained to the patient. Complications include but not limited to esophageal perforation (rare), gastrointestinal bleeding (rare), cardiac arrhythmia which can include cardiac arrest and death (rare), pharyngeal irritation / discomfort with swallowing / hematoma, methemoglobinemia, bronchospasm, transient hypoxia, nonsustained ventricular tachycardia, transient atrial fibrillation, minimal hemoptysis, vomiting, hypotension, respiratory compromise, reaction to medications, unavoidable damage to teeth and gums, aspiration pneumonia  were reviewed with the patient.  Patient voices understands, provides verbal feedback, questions answered, and patient wishes to proceed with the procedure.   Risks, benefits, and alternatives of direct current cardioversion reviewed with the and patient. Risk includes but not limited to: potential for post-cardioversion rhythms, life-threatening arrhythmias (ventricular tachycardia and fibrillation, profound bradycardia, cardiac arrest), myocardial damage, acute pulmonary edema, skin burns, transient hypotension. Benefits include restoration of sinus rhythm. Alternatives to treatment were discussed, questions were answered,  patient voices understanding and provides verbal feedback.  Patient is willing to proceed.   Tessa Lerner, [place], [facility]  Pager: [phone] Office: [phone]

## 2021-11-30 NOTE — Transfer of Care (Signed)
Immediate Anesthesia Transfer of Care Note  Patient: Jordan Sharp  Procedure(s) Performed: TRANSESOPHAGEAL ECHOCARDIOGRAM (TEE) CARDIOVERSION BUBBLE STUDY  Patient Location: PACU  Anesthesia Type:General  Level of Consciousness: awake, alert  and oriented  Airway & Oxygen Therapy: Patient Spontanous Breathing and Patient connected to face mask oxygen  Post-op Assessment: Report given to RN and Post -op Vital signs reviewed and stable  Post vital signs: Reviewed and stable  Last Vitals:  Vitals Value Taken Time  BP 112/73 11/30/21 0818  Temp    Pulse 97 11/30/21 0819  Resp 16 11/30/21 0819  SpO2 99 % 11/30/21 0819  Vitals shown include unvalidated device data.  Last Pain:  Vitals:   11/30/21 0658  TempSrc: Temporal  PainSc: 2          Complications: No notable events documented.

## 2021-12-02 ENCOUNTER — Encounter (HOSPITAL_COMMUNITY): Payer: Self-pay | Admitting: Cardiology

## 2021-12-16 ENCOUNTER — Encounter: Payer: Self-pay | Admitting: Cardiology

## 2021-12-16 ENCOUNTER — Ambulatory Visit: Payer: BC Managed Care – PPO | Admitting: Cardiology

## 2021-12-16 ENCOUNTER — Other Ambulatory Visit: Payer: Self-pay

## 2021-12-16 VITALS — BP 121/73 | HR 63 | Temp 97.2°F | Resp 16 | Ht 72.0 in | Wt 219.0 lb

## 2021-12-16 DIAGNOSIS — E1165 Type 2 diabetes mellitus with hyperglycemia: Secondary | ICD-10-CM

## 2021-12-16 DIAGNOSIS — Z7901 Long term (current) use of anticoagulants: Secondary | ICD-10-CM

## 2021-12-16 DIAGNOSIS — E1169 Type 2 diabetes mellitus with other specified complication: Secondary | ICD-10-CM

## 2021-12-16 DIAGNOSIS — I10 Essential (primary) hypertension: Secondary | ICD-10-CM

## 2021-12-16 DIAGNOSIS — I4892 Unspecified atrial flutter: Secondary | ICD-10-CM

## 2021-12-16 NOTE — Progress Notes (Signed)
Date:  12/16/2021   ID:  Jordan Sharp, DOB 1956-04-09, MRN [id number]  PCP:  Jordan Amel, MD  Cardiologist:  Rex Kras, DO, [facility] (established care 11/09/2021)  Date: 12/16/21 Last Office Visit: 11/09/2021  Chief Complaint  Patient presents with   Post cardioversion   Follow-up    HPI  Jordan Sharp is a 66 y.o. Caucasian male who presents to the office with a chief complaint of "paroxysmal atrial flutter status post cardioversion." Patient's past medical history and cardiovascular risk factors include: Non-insulin-dependent diabetes mellitus type 2, hypertension, atrial flutter, hypercholesterolemia.   He is referred to the office at the request of Jordan, Dibas, MD for evaluation of Atrial Flutter.  Patient is accompanied by his wife Beverlee Nims at today's office visit.  I saw him originally in January 2023 for new onset of atrial flutter.  After discussing the pathophysiology, disease management, and the importance of restoring normal sinus rhythm patient called back to the office to schedule a TEE guided cardioversion.  He underwent a TEE guided cardioversion on November 30, 2021 and now presents for 2-week follow-up.  He is doing well from a cardiovascular standpoint.  He bring in his blood pressure log for review.  His systolic blood pressures are consistently around 120 mmHg or less on visual estimation and heart rate is around 60 bpm.  He remains in sinus bradycardia.  He does not endorse any evidence of bleeding.  FUNCTIONAL STATUS: 5 weeks ago used to jog 5 to 6 miles per day.  Urine  ALLERGIES: No Known Allergies   MEDICATION LIST PRIOR TO VISIT: Current Meds  Medication Sig   atorvastatin (LIPITOR) 10 MG tablet Take 10 mg by mouth every evening.   Cholecalciferol (VITAMIN D3) 1.25 MG (50000 UT) CAPS Take 50,000 Units by mouth every evening.   Coenzyme Q10 (CO Q 10) 100 MG CAPS Take 100 mg by mouth every evening.   ELIQUIS 5 MG TABS tablet Take 5 mg by mouth 2  (two) times daily.   Ginger, Zingiber officinalis, (GINGER ROOT PO) Take 540 mg by mouth daily at 12 noon.   glipiZIDE (GLUCOTROL XL) 5 MG 24 hr tablet Take 5 mg by mouth every morning.   losartan (COZAAR) 25 MG tablet Take 25 mg by mouth in the morning.   metFORMIN (GLUCOPHAGE-XR) 500 MG 24 hr tablet Take 500 mg by mouth every evening.   metoprolol tartrate (LOPRESSOR) 50 MG tablet Take 1 tablet (50 mg total) by mouth 2 (two) times daily. Hold if systolic blood pressure (top number) less than 100 mmHg or pulse less than 55 bpm.   Multiple Vitamins-Minerals (MULTIVITAMIN MEN PO) Take 1 tablet by mouth in the morning.   Omega-3 Fatty Acids (ULTRA OMEGA-3 FISH OIL) 1400 MG CAPS Take 1,400 mg by mouth every evening.   OZEMPIC, 0.25 OR 0.5 MG/DOSE, 2 MG/1.5ML SOPN Inject 0.25 mg into the skin once a week.     PAST MEDICAL HISTORY: Past Medical History:  Diagnosis Date   Atrial flutter ([place])    Diabetes ([place])    Hyperlipidemia    Hypertension     PAST SURGICAL HISTORY: Past Surgical History:  Procedure Laterality Date   BUBBLE STUDY  11/30/2021   Procedure: BUBBLE STUDY;  Surgeon: Rex Kras, DO;  Location: [place];  Service: Cardiovascular;;   CARDIOVERSION N/A 11/30/2021   Procedure: CARDIOVERSION;  Surgeon: Rex Kras, DO;  Location: [place];  Service: Cardiovascular;  Laterality: N/A;   TEE WITHOUT CARDIOVERSION N/A 11/30/2021   Procedure:  TRANSESOPHAGEAL ECHOCARDIOGRAM (TEE);  Surgeon: Rex Kras, DO;  Location: [facility];  Service: Cardiovascular;  Laterality: N/A;    FAMILY HISTORY: The patient family history includes Diabetes in his brother and mother.  SOCIAL HISTORY:  The patient  reports that he has never smoked. He does not have any smokeless tobacco history on file. He reports current alcohol use of about 2.0 standard drinks per week. He reports that he does not use drugs.  REVIEW OF SYSTEMS: Review of Systems  Cardiovascular:  Negative for chest pain,  dyspnea on exertion, leg swelling, orthopnea, palpitations, paroxysmal nocturnal dyspnea and syncope.  Respiratory:  Negative for shortness of breath.    PHYSICAL EXAM: Vitals with BMI 12/16/2021 11/30/2021 11/30/2021  Height 6\' 0"  - 6\' 0"   Weight 219 lbs - 216 lbs  BMI [id number] - [id number]  Systolic 123XX123 [id number]  Diastolic 73 83 [id number]  Pulse 63 100 108    CONSTITUTIONAL: Age-appropriate, well-developed and well-nourished. No acute distress.  SKIN: Skin is warm and dry. No rash noted. No cyanosis. No pallor. No jaundice HEAD: Normocephalic and atraumatic.  EYES: No scleral icterus MOUTH/THROAT: Moist oral membranes.  NECK: No JVD present. No thyromegaly noted. No carotid bruits  LYMPHATIC: No visible cervical adenopathy.  CHEST Normal respiratory effort. No intercostal retractions  LUNGS: Clear to auscultation bilaterally.  No stridor. No wheezes. No rales.  CARDIOVASCULAR: Irregularly irregular, variable S1-S2 no murmurs rubs or gallops appreciated. ABDOMINAL: Soft, nontender, nondistended, positive bowel sounds in all 4 quadrants no apparent ascites.  EXTREMITIES: no peripheral edema, warm to touch, 2+ right lower extremity DP and PT pulses, 1+ left lower extremity DP and PT pulses.  Range of motion of the left leg reduced on physical examination. HEMATOLOGIC: No significant bruising NEUROLOGIC: Oriented to person, place, and time. Nonfocal. Normal muscle tone.  PSYCHIATRIC: Normal mood and affect. Normal behavior. Cooperative  CARDIAC DATABASE: TEE guided DDCV:  11/30/2021 : 150 J x 1 converted to sinus tachycardia.  EKG: 11/09/2021: Atrial flutter with variable conduction, 82 bpm, consider old inferior infarct.  12/16/2021: Sinus bradycardia, 58 bpm, without underlying ischemia injury pattern.  Echocardiogram: No results found for this or any previous visit from the past 1095 days.   Stress Testing: No results found for this or any previous visit from the past 1095 days.   Heart  Catheterization: None  Lower extremity venous duplex 11/02/2021 No evidence of deep venous thrombosis  LABORATORY DATA: CBC Latest Ref Rng & Units 11/29/2021  WBC 3.4 - 10.8 x10E3/uL 5.4  Hemoglobin 13.0 - 17.7 g/dL 15.0  Hematocrit 37.5 - 51.0 % 43.3  Platelets 150 - 450 x10E3/uL 228    CMP Latest Ref Rng & Units 11/29/2021  Glucose 70 - 99 mg/dL 147(H)  BUN 8 - 27 mg/dL 15  Creatinine 0.76 - 1.27 mg/dL 1.16  Sodium 134 - 144 mmol/L 142  Potassium 3.5 - 5.2 mmol/L 4.3  Chloride 96 - 106 mmol/L 104  CO2 20 - 29 mmol/L 21  Calcium 8.6 - 10.2 mg/dL 9.3    Lipid Panel  No results found for: CHOL, TRIG, HDL, CHOLHDL, VLDL, LDLCALC, LDLDIRECT, LABVLDL  No components found for: NTPROBNP No results for input(s): PROBNP in the last 8760 hours. No results for input(s): TSH in the last 8760 hours.  BMP Recent Labs    11/29/21 0807  NA 142  K 4.3  CL 104  CO2 21  GLUCOSE 147*  BUN 15  CREATININE 1.16  CALCIUM 9.3    HEMOGLOBIN  A1C No results found for: HGBA1C, MPG  External Labs: Collected: 10/29/2021. A1c 8.7 Hemoglobin 15.6 g/dL, hematocrit 45.9% BUN 17, creatinine 1.05 mg/dL Sodium 137, potassium 4.7, chloride 99, bicarb 23, AST 13, ALT 17, alkaline phosphatase 56 Total cholesterol 186, triglycerides 130, HDL 54, LDL 109, non-HDL 132  External Labs: Collected: 10/29/2021 provided by PCP A1c 8.7. Hemoglobin 15.6 g/dL, hematocrit 45.9% BUN 17, creatinine 1.05 mg/dL. Sodium 137, potassium 4.7, chloride 99, bicarb 23. AST 13, ALT 17, alkaline phosphatase 56 Total cholesterol 196, triglycerides 130, HDL 54, LDL 109 non-HDL 132  IMPRESSION:    ICD-10-CM   1. Paroxysmal atrial flutter (HCC)  I48.92 EKG 12-Lead    Hemoglobin and hematocrit, blood    Basic metabolic panel    2. Long term (current) use of anticoagulants  Z79.01 Hemoglobin and hematocrit, blood    Basic metabolic panel    3. Type 2 diabetes mellitus with hyperglycemia, without long-term current use  of insulin (HCC)  E11.65     4. Type 2 diabetes mellitus with hyperlipidemia (HCC)  E11.69    E78.5     5. Benign hypertension  I10        RECOMMENDATIONS: Trasean Holton is a 66 y.o. Caucasian male whose past medical history and cardiac risk factors include: Non-insulin-dependent diabetes mellitus type 2, hypertension, atrial flutter, hypercholesterolemia.   Paroxysmal atrial flutter (HCC) Rate control: Metoprolol Rhythm control: N/A Thromboembolic prophylaxis: Eliquis Underwent TEE guided cardioversion 11/30/2021 150 J x 1. Recommended transitioning him from Lopressor to Toprol-XL.  However he would like to continue current medical therapy.  Long term (current) use of anticoagulants Indication: Paroxysmal atrial flutter. CHA2DS2-VASc SCORE is 3 which correlates to 3.2% risk of stroke per year (DM, HTN, age). Reemphasized the risks, benefits, and alternatives to oral anticoagulation.   We also discussed watchman as an alternative -we would like to do some additional bleeding prior to considering it.  Type 2 diabetes mellitus with hyperglycemia, without long-term current use of insulin (HCC) Last hemoglobin A1c greater than 8. Educated on importance of glycemic control. Currently managed by primary care provider. Currently on ARB, statin.  Type 2 diabetes mellitus with hyperlipidemia (HCC) Currently on atorvastatin.   He denies myalgia or other side effects. Most recent lipids dated 10/2021 reviewed as noted above. LDL is currently not at goal. Recommended increasing atorvastatin to 20 mg p.o. nightly-I have asked him to discuss it further with PCP. Currently managed by primary care provider.  Benign hypertension Very well controlled. Continue current medical therapy.  Given his cardiovascular risk factors, new onset of atrial flutter, and estimated 10-year risk of ASCVD recommended ischemic work-up.  However, patient is currently recovering from an injury involving his left  leg I would like to hold off on stress test at this time.  Since he is asymptomatic with regards precordial discomfort this is quite reasonable.  We will reconsider echocardiogram and stress test at the next office visit.  FINAL MEDICATION LIST END OF ENCOUNTER: No orders of the defined types were placed in this encounter.   There are no discontinued medications.    Current Outpatient Medications:    atorvastatin (LIPITOR) 10 MG tablet, Take 10 mg by mouth every evening., Disp: , Rfl:    Cholecalciferol (VITAMIN D3) 1.25 MG (50000 UT) CAPS, Take 50,000 Units by mouth every evening., Disp: , Rfl:    Coenzyme Q10 (CO Q 10) 100 MG CAPS, Take 100 mg by mouth every evening., Disp: , Rfl:    ELIQUIS 5 MG  TABS tablet, Take 5 mg by mouth 2 (two) times daily., Disp: , Rfl:    Ginger, Zingiber officinalis, (GINGER ROOT PO), Take 540 mg by mouth daily at 12 noon., Disp: , Rfl:    glipiZIDE (GLUCOTROL XL) 5 MG 24 hr tablet, Take 5 mg by mouth every morning., Disp: , Rfl:    losartan (COZAAR) 25 MG tablet, Take 25 mg by mouth in the morning., Disp: , Rfl:    metFORMIN (GLUCOPHAGE-XR) 500 MG 24 hr tablet, Take 500 mg by mouth every evening., Disp: , Rfl:    metoprolol tartrate (LOPRESSOR) 50 MG tablet, Take 1 tablet (50 mg total) by mouth 2 (two) times daily. Hold if systolic blood pressure (top number) less than 100 mmHg or pulse less than 55 bpm., Disp: 60 tablet, Rfl: 0   Multiple Vitamins-Minerals (MULTIVITAMIN MEN PO), Take 1 tablet by mouth in the morning., Disp: , Rfl:    Omega-3 Fatty Acids (ULTRA OMEGA-3 FISH OIL) 1400 MG CAPS, Take 1,400 mg by mouth every evening., Disp: , Rfl:    OZEMPIC, 0.25 OR 0.5 MG/DOSE, 2 MG/1.5ML SOPN, Inject 0.25 mg into the skin once a week., Disp: , Rfl:   Orders Placed This Encounter  Procedures   Hemoglobin and hematocrit, blood   Basic metabolic panel   EKG [id number]    There are no Patient Instructions on file for this visit.   --Continue cardiac medications  as reconciled in final medication list. --Return in about 6 months (around 06/15/2022) for Follow up, Atrial flutter. Or sooner if needed. --Continue follow-up with your primary care physician regarding the management of your other chronic comorbid conditions.  Patient's questions and concerns were addressed to his satisfaction. He voices understanding of the instructions provided during this encounter.   This note was created using a voice recognition software as a result there may be grammatical errors inadvertently enclosed that do not reflect the nature of this encounter. Every attempt is made to correct such errors  During today's encounter discussed management of at least 2 chronic comorbid conditions, reviewed recent TEE data, cardioversion reports, ordered additional labs to monitor hemoglobin and renal function as he is on oral anticoagulation.  Discussed disease management, addressed her questions and concerns, and ordered and interpreted his EKG independently.  Rex Kras, [place], [facility]  Pager: [phone] Office: [phone]

## 2021-12-28 ENCOUNTER — Other Ambulatory Visit: Payer: Self-pay | Admitting: Cardiology

## 2021-12-31 ENCOUNTER — Other Ambulatory Visit: Payer: Self-pay

## 2021-12-31 DIAGNOSIS — I4892 Unspecified atrial flutter: Secondary | ICD-10-CM

## 2021-12-31 DIAGNOSIS — Z7901 Long term (current) use of anticoagulants: Secondary | ICD-10-CM

## 2022-06-08 ENCOUNTER — Other Ambulatory Visit: Payer: Self-pay | Admitting: Cardiology

## 2022-06-23 ENCOUNTER — Ambulatory Visit: Payer: BC Managed Care – PPO | Admitting: Cardiology

## 2022-07-22 ENCOUNTER — Encounter: Payer: Self-pay | Admitting: Cardiology

## 2022-07-22 ENCOUNTER — Ambulatory Visit: Payer: BC Managed Care – PPO | Admitting: Cardiology

## 2022-07-22 VITALS — BP 126/69 | HR 76 | Temp 97.1°F | Resp 16 | Ht 72.0 in | Wt 210.0 lb

## 2022-07-22 DIAGNOSIS — I10 Essential (primary) hypertension: Secondary | ICD-10-CM

## 2022-07-22 DIAGNOSIS — I4892 Unspecified atrial flutter: Secondary | ICD-10-CM

## 2022-07-22 DIAGNOSIS — Z7901 Long term (current) use of anticoagulants: Secondary | ICD-10-CM

## 2022-07-22 DIAGNOSIS — E1165 Type 2 diabetes mellitus with hyperglycemia: Secondary | ICD-10-CM

## 2022-07-22 DIAGNOSIS — E1169 Type 2 diabetes mellitus with other specified complication: Secondary | ICD-10-CM

## 2022-07-22 NOTE — Progress Notes (Unsigned)
ID:  Jordan Sharp, DOB 1956-10-07, MRN [id number]  PCP:  Darrow Bussing, MD  Cardiologist:  Tessa Lerner, DO, [facility] (established care 11/09/2021)  Date: 07/22/22 Last Office Visit: 12/16/2021  Chief Complaint  Patient presents with   Atrial Flutter    HPI  Jordan Sharp is a 66 y.o. Caucasian male whose past medical history and cardiovascular risk factors include: Non-insulin-dependent diabetes mellitus type 2, hypertension, atrial flutter, hypercholesterolemia.   He is referred to the office at the request of Sharp, Dibas, MD for evaluation of Atrial Flutter.  Referred to the practice in January 2023 for new onset of atrial flutter.  Underwent TEE guided cardioversion in February 2023.  He now presents for 43-month follow-up visit.  He presents today for regular follow-up visit.  Denies anginal discomfort or heart failure symptoms.  His wife states that he has been under a lot of stress associated with his work.  Overall functional capacity remains relatively stable.  Patient is noted to be in atrial flutter at today's office visit.  Overall euvolemic and not in congestive heart failure.  Patient states that he may have skipped oral anticoagulation within the last 1 week.  He has been on anticoagulation pressure since 07/20/2022.  FUNCTIONAL STATUS: Fasting walking or slow jog 3 to 4.5 miles per day.    ALLERGIES: No Known Allergies   MEDICATION LIST PRIOR TO VISIT: Current Meds  Medication Sig   atorvastatin (LIPITOR) 10 MG tablet Take 10 mg by mouth every evening.   Cholecalciferol (VITAMIN D3) 1.25 MG (50000 UT) CAPS Take 50,000 Units by mouth every evening.   Coenzyme Q10 (CO Q 10) 100 MG CAPS Take 100 mg by mouth every evening.   ELIQUIS 5 MG TABS tablet Take 5 mg by mouth 2 (two) times daily.   Ginger, Zingiber officinalis, (GINGER ROOT PO) Take 540 mg by mouth daily at 12 noon.   glipiZIDE (GLUCOTROL XL) 5 MG 24 hr tablet Take 5 mg by mouth every morning.    losartan (COZAAR) 25 MG tablet Take 25 mg by mouth in the morning.   metFORMIN (GLUCOPHAGE-XR) 500 MG 24 hr tablet Take 500 mg by mouth every evening.   metoprolol tartrate (LOPRESSOR) 50 MG tablet TAKE 1 TABLET BY MOUTH TWICE DAILY. HOLD IF SYSTOLIC BLOOD PRESSURE( TOP NUMBER) IS LESS THAN 100 MMHG OR PULSE LESS THAN 55 BEATS PER MINUTE   Multiple Vitamins-Minerals (MULTIVITAMIN MEN PO) Take 1 tablet by mouth in the morning.   Omega-3 Fatty Acids (ULTRA OMEGA-3 FISH OIL) 1400 MG CAPS Take 1,400 mg by mouth every evening.   OZEMPIC, 0.25 OR 0.5 MG/DOSE, 2 MG/1.5ML SOPN Inject 0.25 mg into the skin once a week.     PAST MEDICAL HISTORY: Past Medical History:  Diagnosis Date   Atrial flutter (HCC)    Diabetes (HCC)    Hyperlipidemia    Hypertension     PAST SURGICAL HISTORY: Past Surgical History:  Procedure Laterality Date   BUBBLE STUDY  11/30/2021   Procedure: BUBBLE STUDY;  Surgeon: Tessa Lerner, DO;  [facility];  Service: Cardiovascular;;   CARDIOVERSION N/A 11/30/2021   Procedure: CARDIOVERSION;  Surgeon: Tessa Lerner, DO;  [facility];  Service: Cardiovascular;  Laterality: N/A;   TEE WITHOUT CARDIOVERSION N/A 11/30/2021   Procedure: TRANSESOPHAGEAL ECHOCARDIOGRAM (TEE);  Surgeon: Tessa Lerner, DO;  Location[facility];  Service: Cardiovascular;  Laterality: N/A;    FAMILY HISTORY: The patient family history includes Diabetes in his brother and mother.  SOCIAL HISTORY:  The patient  reports that he has never smoked. He does not have any smokeless tobacco history on file. He reports current alcohol use of about 2.0 standard drinks of alcohol per week. He reports that he does not use drugs.  REVIEW OF SYSTEMS: Review of Systems  Cardiovascular:  Negative for chest pain, dyspnea on exertion, leg swelling, orthopnea, palpitations, paroxysmal nocturnal dyspnea and syncope.  Respiratory:  Negative for shortness of breath.     PHYSICAL EXAM:    07/22/2022    12:57 PM 12/16/2021   11:43 AM 11/30/2021    8:36 AM  Vitals with BMI  Height 6\' 0"  6\' 0"    Weight 210 lbs 219 lbs   BMI 28.47 29.7   Systolic 126 121  Diastolic 69 73 83  Pulse 76 63 100    CONSTITUTIONAL: Age-appropriate, well-developed and well-nourished. No acute distress.  SKIN: Skin is warm and dry. No rash noted. No cyanosis. No pallor. No jaundice HEAD: Normocephalic and atraumatic.  EYES: No scleral icterus MOUTH/THROAT: Moist oral membranes.  NECK: No JVD present. No thyromegaly noted. No carotid bruits  CHEST Normal respiratory effort. No intercostal retractions  LUNGS: Clear to auscultation bilaterally.  No stridor. No wheezes. No rales.  CARDIOVASCULAR: Irregularly irregular, variable S1-S2 no murmurs rubs or gallops appreciated. ABDOMINAL: Soft, nontender, nondistended, positive bowel sounds in all 4 quadrants no apparent ascites.  EXTREMITIES: no peripheral edema, warm to touch, 2+ right lower extremity DP and PT pulses, 1+ left lower extremity DP and PT pulses.  Range of motion of the left leg reduced on physical examination. HEMATOLOGIC: No significant bruising NEUROLOGIC: Oriented to person, place, and time. Nonfocal. Normal muscle tone.  PSYCHIATRIC: Normal mood and affect. Normal behavior. Cooperative  CARDIAC DATABASE: TEE guided DDCV:  11/30/2021 : 150 J x 1 converted to sinus tachycardia.  EKG: 11/09/2021: Atrial flutter with variable conduction, 82 bpm, consider old inferior infarct.  12/16/2021: Sinus bradycardia, 58 bpm, without underlying ischemia injury pattern. 07/22/2022: Atrial flutter 4:1, 76bpm, without underlying injury pattern.   Echocardiogram: TEE 11/30/2021:  1. Left ventricular ejection fraction, by estimation, is 60 to 65%. The  left ventricle has normal function. The left ventricle has no regional  wall motion abnormalities. Left ventricular diastolic function could not  be evaluated.   2. Right ventricular systolic function is normal.  The right ventricular  size is normal.   3. No left atrial/left atrial appendage thrombus was detected. The LAA  emptying velocity was 47 cm/s.   4. The pericardial effusion is anterior to the right ventricle.   5. The mitral valve is normal in structure. Mild mitral valve  regurgitation. No evidence of mitral stenosis.   6. The aortic valve is tricuspid. Aortic valve regurgitation is not  visualized. No aortic stenosis is present.   7. Agitated saline contrast bubble study was negative, with no evidence  of any interatrial shunt.   Lower extremity venous duplex 11/02/2021 No evidence of deep venous thrombosis  LABORATORY DATA:    Latest Ref Rng & Units 11/29/2021    8:07 AM  CBC  WBC 3.4 - 10.8 x10E3/uL 5.4   Hemoglobin 13.0 - 17.7 g/dL 12/31/2021   Hematocrit 01/27/2022 - 51.0 % 43.3   Platelets 150 - 450 x10E3/uL 228        Latest Ref Rng & Units 11/29/2021    8:07 AM  CMP  Glucose 70 - 99 mg/dL 69.6   BUN 8 - 27 mg/dL 15   Creatinine 01/27/2022 -  1.27 mg/dL 1.19   Sodium 417 - 408 mmol/L 142   Potassium 3.5 - 5.2 mmol/L 4.3   Chloride 96 - 106 mmol/L 104   CO2 20 - 29 mmol/L 21   Calcium 8.6 - 10.2 mg/dL 9.3     Lipid Panel  No results found for: "CHOL", "TRIG", "HDL", "CHOLHDL", "VLDL", "LDLCALC", "LDLDIRECT", "LABVLDL"  No components found for: "NTPROBNP" No results for input(s): "PROBNP" in the last 8760 hours. No results for input(s): "TSH" in the last 8760 hours.  BMP Recent Labs    11/29/21 0807  NA 142  K 4.3  CL 104  CO2 21  GLUCOSE 147*  BUN 15  CREATININE 1.16  CALCIUM 9.3    HEMOGLOBIN A1C No results found for: "HGBA1C", "MPG"  External Labs: Collected: 10/29/2021. A1c 8.7 Hemoglobin 15.6 g/dL, hematocrit 14.4% BUN 17, creatinine 1.05 mg/dL Sodium 818, potassium 4.7, chloride 99, bicarb 23, AST 13, ALT 17, alkaline phosphatase 56 Total cholesterol 186, triglycerides 130, HDL 54, LDL 109, non-HDL 132  External Labs: Collected: 10/29/2021 provided by  PCP A1c 8.7. Hemoglobin 15.6 g/dL, hematocrit 56.3% BUN 17, creatinine 1.05 mg/dL. Sodium 137, potassium 4.7, chloride 99, bicarb 23. AST 13, ALT 17, alkaline phosphatase 56 Total cholesterol 196, triglycerides 130, HDL 54, LDL 109 non-HDL 132  IMPRESSION:    ICD-10-CM   1. Paroxysmal atrial flutter (HCC)  I48.92 EKG 12-Lead    Ambulatory referral to Cardiac Electrophysiology    Basic metabolic panel    Magnesium    CBC    2. Long term (current) use of anticoagulants  Z79.01 Basic metabolic panel    Magnesium    CBC    3. Type 2 diabetes mellitus with hyperglycemia, without long-term current use of insulin (HCC)  E11.65     4. Type 2 diabetes mellitus with hyperlipidemia (HCC)  E11.69    E78.5     5. Benign hypertension  I10        RECOMMENDATIONS: Sunny Gains is a 66 y.o. Caucasian male whose past medical history and cardiac risk factors include: Non-insulin-dependent diabetes mellitus type 2, hypertension, paroxysmal atrial flutter, hypercholesterolemia.   Paroxysmal atrial flutter (HCC) EKG illustrates atrial flutter with controlled ventricular rate. Rate control: Metoprolol. Rhythm control: N/A. Thromboembolic prophylaxis: Eliquis. Underwent TEE guided cardioversion in February 2023 150 J x 1 Discussed undergoing cardioversion; however, patient states that he he may have skipped his blood thinners over the last 1 week. He has been on anticoagulation for sure since 07/20/2022. We will refer him to EP for consideration of atrial flutter ablation as this is second episode. Patient will be scheduled for direct-current cardioversion in 4 weeks to restore normal sinus rhythm.  Risks, benefits, and alternatives of direct current cardioversion reviewed with the and patient and his wife. Risk includes but not limited to: potential for post-cardioversion rhythms, life-threatening arrhythmias (ventricular tachycardia and fibrillation, profound bradycardia, cardiac arrest),  myocardial damage, acute pulmonary edema, skin burns, transient hypotension. Benefits include restoration of sinus rhythm. Alternatives to treatment were discussed, questions were answered, patient voices understanding and provides verbal feedback.  Patient is willing to proceed.   Long term (current) use of anticoagulants Indication: Paroxysmal atrial flutter CHA2DS2-VASc SCORE is 3 which correlates to 3.2% risk of stroke per year (DM, HTN, age).  Reemphasized the risks, benefits, and alternatives to oral anticoagulation.   Type 2 diabetes mellitus with hyperglycemia, without long-term current use of insulin (HCC) Reemphasized importance of glycemic control. Currently managed by primary care provider.  Type  2 diabetes mellitus with hyperlipidemia (HCC) Currently on atorvastatin.   He denies myalgia or other side effects. Most recent lipids dated January 2023, reviewed as noted above. Patient is aware that his goal LDL should be at least less than 70 mg/dL Currently managed by primary care provider.  Benign hypertension Blood pressures are well controlled. Medications reconciled.   FINAL MEDICATION LIST END OF ENCOUNTER: No orders of the defined types were placed in this encounter.   There are no discontinued medications.    Current Outpatient Medications:    atorvastatin (LIPITOR) 10 MG tablet, Take 10 mg by mouth every evening., Disp: , Rfl:    Cholecalciferol (VITAMIN D3) 1.25 MG (50000 UT) CAPS, Take 50,000 Units by mouth every evening., Disp: , Rfl:    Coenzyme Q10 (CO Q 10) 100 MG CAPS, Take 100 mg by mouth every evening., Disp: , Rfl:    ELIQUIS 5 MG TABS tablet, Take 5 mg by mouth 2 (two) times daily., Disp: , Rfl:    Ginger, Zingiber officinalis, (GINGER ROOT PO), Take 540 mg by mouth daily at 12 noon., Disp: , Rfl:    glipiZIDE (GLUCOTROL XL) 5 MG 24 hr tablet, Take 5 mg by mouth every morning., Disp: , Rfl:    losartan (COZAAR) 25 MG tablet, Take 25 mg by mouth in the  morning., Disp: , Rfl:    metFORMIN (GLUCOPHAGE-XR) 500 MG 24 hr tablet, Take 500 mg by mouth every evening., Disp: , Rfl:    metoprolol tartrate (LOPRESSOR) 50 MG tablet, TAKE 1 TABLET BY MOUTH TWICE DAILY. HOLD IF SYSTOLIC BLOOD PRESSURE( TOP NUMBER) IS LESS THAN 100 MMHG OR PULSE LESS THAN 55 BEATS PER MINUTE, Disp: 180 tablet, Rfl: 1   Multiple Vitamins-Minerals (MULTIVITAMIN MEN PO), Take 1 tablet by mouth in the morning., Disp: , Rfl:    Omega-3 Fatty Acids (ULTRA OMEGA-3 FISH OIL) 1400 MG CAPS, Take 1,400 mg by mouth every evening., Disp: , Rfl:    OZEMPIC, 0.25 OR 0.5 MG/DOSE, 2 MG/1.5ML SOPN, Inject 0.25 mg into the skin once a week., Disp: , Rfl:   Orders Placed This Encounter  Procedures   Basic metabolic panel   Magnesium   CBC   Ambulatory referral to Cardiac Electrophysiology   EKG 12-Lead    There are no Patient Instructions on file for this visit.   --Continue cardiac medications as reconciled in final medication list. --Return in about 3 months (around 10/21/2022) for Follow up, Atrial flutter. Or sooner if needed. --Continue follow-up with your primary care physician regarding the management of your other chronic comorbid conditions.  Patient's questions and concerns were addressed to his satisfaction. He voices understanding of the instructions provided during this encounter.   This note was created using a voice recognition software as a result there may be grammatical errors inadvertently enclosed that do not reflect the nature of this encounter. Every attempt is made to correct such errors  Mechele Claude [facility]  Pager: [phone] Office: [phone]

## 2022-08-17 ENCOUNTER — Encounter (HOSPITAL_COMMUNITY): Payer: Self-pay

## 2022-08-17 ENCOUNTER — Ambulatory Visit (HOSPITAL_COMMUNITY): Admit: 2022-08-17 | Payer: BC Managed Care – PPO | Admitting: Cardiology

## 2022-08-17 SURGERY — CARDIOVERSION
Anesthesia: General

## 2022-08-22 ENCOUNTER — Telehealth: Payer: Self-pay

## 2022-08-22 ENCOUNTER — Telehealth: Payer: Self-pay | Admitting: Internal Medicine

## 2022-08-22 ENCOUNTER — Encounter: Payer: Self-pay | Admitting: Internal Medicine

## 2022-08-22 ENCOUNTER — Ambulatory Visit: Payer: BC Managed Care – PPO | Attending: Internal Medicine | Admitting: Internal Medicine

## 2022-08-22 VITALS — BP 118/71 | HR 76 | Ht 72.0 in | Wt 200.0 lb

## 2022-08-22 DIAGNOSIS — I10 Essential (primary) hypertension: Secondary | ICD-10-CM

## 2022-08-22 DIAGNOSIS — I483 Typical atrial flutter: Secondary | ICD-10-CM

## 2022-08-22 NOTE — Patient Instructions (Addendum)
Medication Instructions:  Your physician recommends that you continue on your current medications as directed. Please refer to the Current Medication list given to you today.  *If you need a refill on your cardiac medications before your next appointment, please call your pharmacy*  Lab Work: None ordered.  If you have labs (blood work) drawn today and your tests are completely normal, you will receive your results only by: MyChart Message (if you have MyChart) OR A paper copy in the mail If you have any lab test that is abnormal or we need to change your treatment, we will call you to review the results.  Testing/Procedures: None ordered.  Follow-Up: Dates of 11/6 and 12/4, and 12/13 given to Patient for Atrial Flutter Ablation with Dr. Lewayne BuntingGregg Taylor.     Cardiac Ablation Cardiac ablation is a procedure to destroy, or ablate, a small amount of heart tissue that is causing problems. The heart has many electrical connections. Sometimes, these connections are abnormal and can cause the heart to beat very fast or irregularly. Ablating the abnormal areas can improve the heart's rhythm or return it to normal. Ablation may be done for people who: Have irregular or rapid heartbeats (arrhythmias). Have Wolff-Parkinson-White syndrome. Have taken medicines for an arrhythmia that did not work or caused side effects. Have a high-risk heartbeat that may be life-threatening. Tell a health care provider about: Any allergies you have. All medicines you are taking, including vitamins, herbs, eye drops, creams, and over-the-counter medicines. Any problems you or family members have had with anesthesia. Any bleeding problems you have. Any surgeries you have had. Any medical conditions you have. Whether you are pregnant or may be pregnant. What are the risks? Your health care provider will talk with you about risks. These may include: Infection. Bruising and bleeding. Stroke or blood clots. Damage  to nearby structures or organs. Allergic reaction to medicines or dyes. Needing a pacemaker if the heart gets damaged. A pacemaker is a device that helps the heart beat normally. Failure of the procedure. A repeat procedure may be needed. What happens before the procedure? Medicines Ask your health care provider about: Changing or stopping your regular medicines. These include any heart rhythm medicines, diabetes medicines, or blood thinners you take. Taking medicines such as aspirin and ibuprofen. These medicines can thin your blood. Do not take them unless your health care provider tells you to. Taking over-the-counter medicines, vitamins, herbs, and supplements. General instructions Follow instructions from your health care provider about what you may eat and drink. If you will be going home right after the procedure, plan to have a responsible adult: Take you home from the hospital or clinic. You will not be allowed to drive. Care for you for the time you are told. Ask your health care provider what steps will be taken to prevent infection. What happens during the procedure?  An IV will be inserted into one of your veins. You may be given: A sedative. This helps you relax. Anesthesia. This will: Numb certain areas of your body. An incision will be made in your neck or your groin. A needle will be inserted through the incision and into a large vein in your neck or groin. The small, thin tube (catheter) will be inserted through the needle and moved to your heart. A type of X-ray (fluoroscopy) will be used to help guide the catheter and provide images of the heart on a monitor. Dye may be injected through the catheter to help your surgeon  see the area of the heart that needs treatment. Electrical currents will be sent from the catheter to destroy heart tissue in certain areas. There are three types of energy that may be used to do this: Heat (radiofrequency energy). Laser  energy. Extreme cold (cryoablation). When the tissue has been destroyed, the catheter will be removed. Pressure will be held on the insertion area to prevent bleeding. A bandage (dressing) will be placed over the insertion area. The procedure may vary among health care providers and hospitals. What happens after the procedure? Your blood pressure, heart rate and rhythm, breathing rate, and blood oxygen level will be monitored until you leave the hospital or clinic. Your insertion area will be checked for bleeding. You will need to lie still for a few hours. If your groin was used, you will need to keep your leg straight for a few hours after the catheter is removed. This information is not intended to replace advice given to you by your health care provider. Make sure you discuss any questions you have with your health care provider. Document Revised: 03/29/2022 Document Reviewed: 03/29/2022 Elsevier Patient Education  [place].

## 2022-08-22 NOTE — Telephone Encounter (Signed)
Pt calling to inform provider that he is willing to have Ablation on 09/26/22. Please advise

## 2022-08-22 NOTE — Telephone Encounter (Signed)
Error

## 2022-08-22 NOTE — Progress Notes (Signed)
Electrophysiology TeleHealth Note   Due to national recommendations of social distancing due to COVID 19, an audio/video telehealth visit is felt to be most appropriate for this patient at this time.  See MyChart message from today for the patient's consent to telehealth for [facility].   Date:  08/22/2022   ID:  Jordan Sharp, DOB 15-Oct-1956, MRN [id number]  Location: patient's home  Provider location: [address] [place]  Evaluation Performed: Follow-up visit  PCP:  Lujean Amel, MD  Cardiologist:  None Dr. Terri Skains Electrophysiologist:  Dr Lovena Le  Chief Complaint:  atrial flutter  History of Present Illness:    Jordan Sharp is a 66 y.o. male who presents via audio/video conferencing for a telehealth visit today.  He has active Covid infection. He is a pleasant 66 yo man with a h/o atrial flutter s/p DCCV who then developed recurrent flutter. He has minimal palpitations but notes that when he is out of rhythm, he is unable run like he can when he is in rhythm. He has been on systemic anti-coagulation with a CHADSVASC score of 3. No syncope. No chest pain.   Past Medical History:  Diagnosis Date   Atrial flutter ([place])    Diabetes ([place])    Hyperlipidemia    Hypertension     Past Surgical History:  Procedure Laterality Date   BUBBLE STUDY  11/30/2021   Procedure: BUBBLE STUDY;  Surgeon: Rex Kras, DO;  Location: [facility];  Service: Cardiovascular;;   CARDIOVERSION N/A 11/30/2021   Procedure: CARDIOVERSION;  Surgeon: Rex Kras, DO;  [facility];  Service: Cardiovascular;  Laterality: N/A;   TEE WITHOUT CARDIOVERSION N/A 11/30/2021   Procedure: TRANSESOPHAGEAL ECHOCARDIOGRAM (TEE);  Surgeon: Rex Kras, DO;  [facility];  Service: Cardiovascular;  Laterality: N/A;    Current Outpatient Medications  Medication Sig Dispense Refill   atorvastatin (LIPITOR) 20 MG tablet Take 20 mg by mouth daily.     Cholecalciferol  (VITAMIN D3) 1.25 MG (50000 UT) CAPS Take 50,000 Units by mouth every evening.     Coenzyme Q10 (CO Q 10) 100 MG CAPS Take 100 mg by mouth every evening.     ELIQUIS 5 MG TABS tablet Take 5 mg by mouth 2 (two) times daily.     Ginger, Zingiber officinalis, (GINGER ROOT PO) Take 540 mg by mouth 2 (two) times daily.     glipiZIDE (GLUCOTROL XL) 2.5 MG 24 hr tablet Take 2.5 mg by mouth every morning.     losartan (COZAAR) 25 MG tablet Take 25 mg by mouth in the morning.     metFORMIN (GLUCOPHAGE-XR) 500 MG 24 hr tablet Take 500 mg by mouth every evening.     metoprolol tartrate (LOPRESSOR) 50 MG tablet TAKE 1 TABLET BY MOUTH TWICE DAILY. HOLD IF SYSTOLIC BLOOD PRESSURE( TOP NUMBER) IS LESS THAN 100 MMHG OR PULSE LESS THAN 55 BEATS PER MINUTE 180 tablet 1   Multiple Vitamins-Minerals (MULTIVITAMIN MEN PO) Take 1 tablet by mouth in the morning.     Omega-3 Fatty Acids (ULTRA OMEGA-3 FISH OIL) 1400 MG CAPS Take 1,400 mg by mouth every evening.     OZEMPIC, 0.25 OR 0.5 MG/DOSE, 2 MG/1.5ML SOPN Inject 0.25 mg into the skin once a week.     No current facility-administered medications for this visit.    Allergies:   Patient has no known allergies.   Social History:  The patient  reports that he has never smoked. He does not have  any smokeless tobacco history on file. He reports current alcohol use of about 2.0 standard drinks of alcohol per week. He reports that he does not use drugs.   Family History:  The patient's  family history includes Diabetes in his brother and mother.   ROS:  Please see the history of present illness.   All other systems are personally reviewed and negative.    Exam:    Vital Signs:  BP 118/71   Pulse 76   Ht 6' (1.829 m)   Wt 200 lb (90.7 kg)   BMI 27.12 kg/m   Well appearing, alert and conversant, regular work of breathing,  good skin color Eyes- anicteric, neuro- grossly intact, skin- no apparent rash or lesions or cyanosis, mouth- oral mucosa is  pink   Labs/Other Tests and Data Reviewed:    Recent Labs: 11/29/2021: BUN 15; Creatinine, Ser 1.16; Hemoglobin 15.0; Platelets 228; Potassium 4.3; Sodium 142   Wt Readings from Last 3 Encounters:  08/22/22 200 lb (90.7 kg)  07/22/22 210 lb (95.3 kg)  12/16/21 219 lb (99.3 kg)     Other studies personally reviewed: Additional studies/ records that were reviewed today include: none  Review of the above records today demonstrates: as above Prior radiographs: reviewed  The patient presents wearable device technology report for my review today. On my review, the patient presents with Apple Watch tracings from n/a . The tracings reveal n/a  ASSESSMENT & PLAN:    1.  Recurrent atrial flutter - I have discussed the treatment options with the patient. The risks/benefits/goals/expectations of EP study and ablation were reviewed and he will call us if he wishes to proceed.     Patient Risk:  after full review of this patients clinical status, I feel that they are at moderate risk at this time.  Today, I have spent 20 minutes with the patient with telehealth technology discussing all of the above .    Signed, [name], MD  08/22/2022 8:51 AM     [address] [place] [address] [phone] (office) [phone] (fax)

## 2022-08-22 NOTE — Telephone Encounter (Signed)
Pt is requesting 9:00 A.M. consult with Dr. Lovena Le be changed to a virtual visit, due to testing positive for covid Saturday. Req

## 2022-09-21 ENCOUNTER — Other Ambulatory Visit: Payer: BC Managed Care – PPO

## 2022-09-22 ENCOUNTER — Telehealth: Payer: Self-pay | Admitting: Internal Medicine

## 2022-09-22 NOTE — Telephone Encounter (Signed)
Wife stated they are changing insurance and wants to reschedule the patient's surgery to the last two weeks in January.  Wife stated the patient can be contacted directly for further information.

## 2022-09-27 ENCOUNTER — Telehealth: Payer: Self-pay | Admitting: Internal Medicine

## 2022-09-27 NOTE — Telephone Encounter (Signed)
Patient is calling about a MyChart message he sent to reschedule his procedure from December to January, as his insurance is changing.  He would like a call back.

## 2022-09-29 NOTE — Telephone Encounter (Signed)
Pt called back per message below.    Pt stated that he had to cancel / reschedule his atrial flutter ablation with Dr. Ladona Ridgel, due to an insurance concern.    Pt procedure on 10/05/22 at 830 am was cancelled, and moved to new procedure date of 11/21/2022 at 730 am.    Pt wants labs drawn on 11/14/2022 at [address] office.    Needed, Labs, procedure letter, Carto / Anesthesia notified, orders and follow up appointment scheduled.    Will make scheduling and precert aware of this.

## 2022-09-29 NOTE — Telephone Encounter (Signed)
Pt states that he has been waiting for someone to contact him to change his procedure date with no response. He is concerned about it not getting cancelled in time.  I told him that the nurse who we have been trying to reach in regards to that is currently out of the office until 12/13, which is his procedure date. He would like this to be taken care of before this date. Please advise.

## 2022-10-05 ENCOUNTER — Other Ambulatory Visit: Payer: Self-pay

## 2022-10-05 DIAGNOSIS — I483 Typical atrial flutter: Secondary | ICD-10-CM

## 2022-10-05 NOTE — Telephone Encounter (Signed)
Work up complete for ablation.... Instruction letter sent via MyChart.

## 2022-10-05 NOTE — Telephone Encounter (Signed)
Pt has been rescheduled to 11/21/22 per Weston Brass...  He will have labs done on 11/14/22

## 2022-10-21 ENCOUNTER — Ambulatory Visit: Payer: BC Managed Care – PPO | Admitting: Cardiology

## 2022-11-10 ENCOUNTER — Ambulatory Visit: Payer: Self-pay | Admitting: Internal Medicine

## 2022-11-14 ENCOUNTER — Other Ambulatory Visit: Payer: Self-pay

## 2022-11-14 ENCOUNTER — Encounter: Payer: Self-pay | Admitting: Internal Medicine

## 2022-11-15 ENCOUNTER — Telehealth: Payer: Self-pay | Admitting: Internal Medicine

## 2022-11-15 NOTE — Telephone Encounter (Signed)
Pt called back and shared [facility] fax number with Pt.   Pt PCP Dr. Dorthy Cooler is outside of [facility].  Pt will contact his PCP office, and obtain the labs required for his upcoming ablation.    Will forward note to scheduling.

## 2022-11-15 NOTE — Telephone Encounter (Signed)
Patient calling to see if his lab results were received from his PCP. He says the labs are for his upcoming ablation.

## 2022-11-15 NOTE — Telephone Encounter (Signed)
I called pt back and he said he had just spoke with Merrilee Seashore, Therapist, sports. He will get [place] a copy of his labs prior to his procedure.

## 2022-11-17 NOTE — Telephone Encounter (Signed)
NOTE:  Pt dropped labs off to [place] on [address] on 11/15/2022.  The CBC and CMP was received, recently drawn from outside office.   The Lab Reports were taken to the front desk to be scanned into the patients chart for Dr. Lovena Le to review; prior to Atrial Flutter Ablation scheduled for 11/21/2022.    I checked on 11/17/2022 to see if Labs had been scanned into Pt heath record, Labs were not found? I will contact the front desk staff at [facility] for follow up.

## 2022-11-18 NOTE — Pre-Procedure Instructions (Signed)
Instructed patient on the following items: Arrival time 0515 Nothing to eat or drink after midnight No meds AM of procedure Responsible person to drive you home and stay with you for 24 hrs  Have you missed any doses of anti-coagulant Eliquis- hasn't missed any doses recently, instructed to continue to take this weekend, don't take any on Monday morning.

## 2022-11-20 NOTE — Anesthesia Preprocedure Evaluation (Signed)
Anesthesia Evaluation  Patient identified by MRN, date of birth, ID band Patient awake    Reviewed: Allergy & Precautions, NPO status , Patient's Chart, lab work & pertinent test results, reviewed documented beta blocker date and time   Airway Mallampati: III  TM Distance: >3 FB Neck ROM: Full    Dental  (+) Teeth Intact, Dental Advisory Given   Pulmonary neg pulmonary ROS   Pulmonary exam normal breath sounds clear to auscultation       Cardiovascular hypertension, Pt. on medications and Pt. on home beta blockers + dysrhythmias Atrial Fibrillation  Rhythm:Irregular Rate:Normal     Neuro/Psych negative neurological ROS     GI/Hepatic negative GI ROS, Neg liver ROS,,,  Endo/Other  diabetes, Type 2, Oral Hypoglycemic Agents    Renal/GU negative Renal ROS     Musculoskeletal negative musculoskeletal ROS (+)    Abdominal   Peds  Hematology  (+) Blood dyscrasia (Eliquis)   Anesthesia Other Findings   Reproductive/Obstetrics                             Anesthesia Physical Anesthesia Plan  ASA: 3  Anesthesia Plan: General   Post-op Pain Management: Tylenol PO (pre-op)*   Induction: Intravenous  PONV Risk Score and Plan: 2 and Dexamethasone and Ondansetron  Airway Management Planned: Oral ETT  Additional Equipment:   Intra-op Plan:   Post-operative Plan: Extubation in OR  Informed Consent: I have reviewed the patients History and Physical, chart, labs and discussed the procedure including the risks, benefits and alternatives for the proposed anesthesia with the patient or authorized representative who has indicated his/her understanding and acceptance.     Dental advisory given  Plan Discussed with: CRNA  Anesthesia Plan Comments:        Anesthesia Quick Evaluation

## 2022-11-21 ENCOUNTER — Other Ambulatory Visit: Payer: Self-pay

## 2022-11-21 ENCOUNTER — Ambulatory Visit (HOSPITAL_COMMUNITY): Payer: Medicare Other | Admitting: Anesthesiology

## 2022-11-21 ENCOUNTER — Encounter (HOSPITAL_COMMUNITY)
Admission: RE | Disposition: A | Discharge status: Home / Self Care | Payer: 59 | Source: Home / Self Care | Attending: Internal Medicine

## 2022-11-21 ENCOUNTER — Ambulatory Visit (HOSPITAL_BASED_OUTPATIENT_CLINIC_OR_DEPARTMENT_OTHER): Payer: Medicare Other | Admitting: Anesthesiology

## 2022-11-21 ENCOUNTER — Ambulatory Visit (HOSPITAL_COMMUNITY)
Admission: RE | Admit: 2022-11-21 | Discharge: 2022-11-21 | Disposition: A | Discharge status: Home / Self Care | Payer: Medicare Other | Attending: Internal Medicine | Admitting: Internal Medicine

## 2022-11-21 ENCOUNTER — Encounter (HOSPITAL_COMMUNITY): Payer: Self-pay | Admitting: Internal Medicine

## 2022-11-21 DIAGNOSIS — Z7984 Long term (current) use of oral hypoglycemic drugs: Secondary | ICD-10-CM | POA: Diagnosis not present

## 2022-11-21 DIAGNOSIS — I483 Typical atrial flutter: Secondary | ICD-10-CM | POA: Diagnosis not present

## 2022-11-21 DIAGNOSIS — I4892 Unspecified atrial flutter: Secondary | ICD-10-CM | POA: Diagnosis not present

## 2022-11-21 DIAGNOSIS — E119 Type 2 diabetes mellitus without complications: Secondary | ICD-10-CM | POA: Diagnosis not present

## 2022-11-21 DIAGNOSIS — Z7985 Long-term (current) use of injectable non-insulin antidiabetic drugs: Secondary | ICD-10-CM | POA: Diagnosis not present

## 2022-11-21 DIAGNOSIS — I10 Essential (primary) hypertension: Secondary | ICD-10-CM

## 2022-11-21 HISTORY — PX: A-FLUTTER ABLATION: EP1230

## 2022-11-21 LAB — GLUCOSE, CAPILLARY
Glucose-Capillary: 132 mg/dL — ABNORMAL HIGH (ref 70–99)
Glucose-Capillary: 128 mg/dL — ABNORMAL HIGH (ref 70–99)

## 2022-11-21 SURGERY — A-FLUTTER ABLATION
Anesthesia: General

## 2022-11-21 MED ORDER — DEXAMETHASONE SODIUM PHOSPHATE 10 MG/ML IJ SOLN
INTRAMUSCULAR | Status: DC | PRN
  Administered 2022-11-21: 10 mg via INTRAVENOUS

## 2022-11-21 MED ORDER — HEPARIN SODIUM (PORCINE) 1000 UNIT/ML IJ SOLN
INTRAMUSCULAR | Status: AC
  Filled 2022-11-21: qty 10

## 2022-11-21 MED ORDER — MIDAZOLAM HCL 2 MG/2ML IJ SOLN
INTRAMUSCULAR | Status: DC | PRN
  Administered 2022-11-21: 2 mg via INTRAVENOUS

## 2022-11-21 MED ORDER — FENTANYL CITRATE (PF) 100 MCG/2ML IJ SOLN
INTRAMUSCULAR | Status: DC | PRN
  Administered 2022-11-21: 25 ug via INTRAVENOUS
  Administered 2022-11-21: 75 ug via INTRAVENOUS

## 2022-11-21 MED ORDER — SODIUM CHLORIDE 0.9% FLUSH: 3.0000 mL | INTRAVENOUS | Status: DC | PRN

## 2022-11-21 MED ORDER — HEPARIN SODIUM (PORCINE) 1000 UNIT/ML IJ SOLN
INTRAMUSCULAR | Status: DC | PRN
  Administered 2022-11-21: 1000 [IU] via INTRAVENOUS

## 2022-11-21 MED ORDER — METOPROLOL TARTRATE 25 MG PO TABS
25.0000 mg | ORAL_TABLET | Freq: Once | ORAL | Status: AC
  Administered 2022-11-21: 25 mg via ORAL
  Filled 2022-11-21: qty 1

## 2022-11-21 MED ORDER — SODIUM CHLORIDE 0.9 % IV SOLN: 250.0000 mL | INTRAVENOUS | Status: DC | PRN

## 2022-11-21 MED ORDER — ACETAMINOPHEN 500 MG PO TABS
1000.0000 mg | ORAL_TABLET | Freq: Once | ORAL | Status: AC
  Administered 2022-11-21: 1000 mg via ORAL
  Filled 2022-11-21: qty 2

## 2022-11-21 MED ORDER — SODIUM CHLORIDE 0.9 % IV SOLN: INTRAVENOUS | Status: DC

## 2022-11-21 MED ORDER — HEPARIN (PORCINE) IN NACL 1000-0.9 UT/500ML-% IV SOLN
INTRAVENOUS | Status: DC | PRN
  Administered 2022-11-21: 500 mL

## 2022-11-21 MED ORDER — ACETAMINOPHEN 325 MG PO TABS: 650.0000 mg | ORAL_TABLET | ORAL | Status: DC | PRN

## 2022-11-21 MED ORDER — SUGAMMADEX SODIUM 200 MG/2ML IV SOLN
INTRAVENOUS | Status: DC | PRN
  Administered 2022-11-21: 200 mg via INTRAVENOUS

## 2022-11-21 MED ORDER — ONDANSETRON HCL 4 MG/2ML IJ SOLN: 4.0000 mg | Freq: Four times a day (QID) | INTRAMUSCULAR | Status: DC | PRN

## 2022-11-21 MED ORDER — PROPOFOL 10 MG/ML IV BOLUS
INTRAVENOUS | Status: DC | PRN
  Administered 2022-11-21: 160 mg via INTRAVENOUS

## 2022-11-21 MED ORDER — PHENYLEPHRINE HCL-NACL 20-0.9 MG/250ML-% IV SOLN
INTRAVENOUS | Status: DC | PRN
  Administered 2022-11-21: 20 ug/min via INTRAVENOUS

## 2022-11-21 MED ORDER — LIDOCAINE 2% (20 MG/ML) 5 ML SYRINGE
INTRAMUSCULAR | Status: DC | PRN
  Administered 2022-11-21: 80 mg via INTRAVENOUS

## 2022-11-21 MED ORDER — ROCURONIUM BROMIDE 10 MG/ML (PF) SYRINGE
PREFILLED_SYRINGE | INTRAVENOUS | Status: DC | PRN
  Administered 2022-11-21: 60 mg via INTRAVENOUS

## 2022-11-21 MED ORDER — PHENYLEPHRINE 80 MCG/ML (10ML) SYRINGE FOR IV PUSH (FOR BLOOD PRESSURE SUPPORT)
PREFILLED_SYRINGE | INTRAVENOUS | Status: DC | PRN
  Administered 2022-11-21 (×2): 80 ug via INTRAVENOUS

## 2022-11-21 MED ORDER — ONDANSETRON HCL 4 MG/2ML IJ SOLN
INTRAMUSCULAR | Status: DC | PRN
  Administered 2022-11-21: 4 mg via INTRAVENOUS

## 2022-11-21 MED ORDER — SODIUM CHLORIDE 0.9% FLUSH: 3.0000 mL | Freq: Two times a day (BID) | INTRAVENOUS | Status: DC

## 2022-11-21 SURGICAL SUPPLY — 14 items
BAG SNAP BAND KOVER 36X36 (MISCELLANEOUS) IMPLANT
CATH JOSEPH QUAD ALLRED 6F REP (CATHETERS) IMPLANT
CATH SMTCH THERMOCOOL SF FJ (CATHETERS) IMPLANT
CATH WEB BI DIR CSDF CRV REPRO (CATHETERS) IMPLANT
PACK EP LATEX FREE (CUSTOM PROCEDURE TRAY) ×1
PACK EP LF (CUSTOM PROCEDURE TRAY) ×1 IMPLANT
PAD DEFIB RADIO PHYSIO CONN (PAD) ×1 IMPLANT
PATCH CARTO3 (PAD) IMPLANT
SHEATH PINNACLE 6F 10CM (SHEATH) IMPLANT
SHEATH PINNACLE 7F 10CM (SHEATH) IMPLANT
SHEATH PINNACLE 8F 10CM (SHEATH) IMPLANT
SHEATH PINNACLE 9F 10CM (SHEATH) IMPLANT
SHEATH SWARTZ RAMP 8.5F 60CM (SHEATH) IMPLANT
TUBING SMART ABLATE COOLFLOW (TUBING) IMPLANT

## 2022-11-21 NOTE — Anesthesia Procedure Notes (Signed)
Procedure Name: Intubation Date/Time: 11/21/2022 7:36 AM  Performed by: Harden Mo, CRNAPre-anesthesia Checklist: Patient identified, Emergency Drugs available, Suction available and Patient being monitored Patient Re-evaluated:Patient Re-evaluated prior to induction Oxygen Delivery Method: Circle System Utilized Preoxygenation: Pre-oxygenation with 100% oxygen Induction Type: IV induction Ventilation: Mask ventilation without difficulty Laryngoscope Size: Miller and 2 Grade View: Grade I Tube type: Oral Tube size: 7.5 mm Number of attempts: 1 Airway Equipment and Method: Stylet and Oral airway Placement Confirmation: ETT inserted through vocal cords under direct vision, positive ETCO2 and breath sounds checked- equal and bilateral Secured at: 23 cm Tube secured with: Tape Dental Injury: Teeth and Oropharynx as per pre-operative assessment

## 2022-11-21 NOTE — H&P (Signed)
Electrophysiology TeleHealth Note     Due to national recommendations of social distancing due to COVID 19, an audio/video telehealth visit is felt to be most appropriate for this patient at this time.  See MyChart message from today for the patient's consent to telehealth for [facility].     Date:  08/22/2022    ID:  Jordan Sharp, DOB 08-21-1956, MRN [id number]  Location: patient's home   Provider location: [address], [place]   Evaluation Performed: Follow-up visit   PCP:  Lujean Amel, MD        Cardiologist:  None Dr. Terri Skains Electrophysiologist:  Dr Lovena Le   Chief Complaint:  atrial flutter   History of Present Illness:     Jordan Sharp is a 67 y.o. male who presents via audio/video conferencing for a telehealth visit today.  He has active Covid infection. He is a pleasant 66 yo man with a h/o atrial flutter s/p DCCV who then developed recurrent flutter. He has minimal palpitations but notes that when he is out of rhythm, he is unable run like he can when he is in rhythm. He has been on systemic anti-coagulation with a CHADSVASC score of 3. No syncope. No chest pain.        Past Medical History:  Diagnosis Date   Atrial flutter ([place])     Diabetes ([place])     Hyperlipidemia     Hypertension             Past Surgical History:  Procedure Laterality Date   BUBBLE STUDY   11/30/2021    Procedure: BUBBLE STUDY;  Surgeon: Rex Kras, DO;  Location: [facility];  Service: Cardiovascular;;   CARDIOVERSION N/A 11/30/2021    Procedure: CARDIOVERSION;  Surgeon: Rex Kras, DO;  Location: [facility];  Service: Cardiovascular;  Laterality: N/A;   TEE WITHOUT CARDIOVERSION N/A 11/30/2021    Procedure: TRANSESOPHAGEAL ECHOCARDIOGRAM (TEE);  Surgeon: Rex Kras, DO;  Location: [facility];  Service: Cardiovascular;  Laterality: N/A;            Current Outpatient Medications  Medication Sig Dispense Refill   atorvastatin (LIPITOR) 20 MG tablet  Take 20 mg by mouth daily.       Cholecalciferol (VITAMIN D3) 1.25 MG (50000 UT) CAPS Take 50,000 Units by mouth every evening.       Coenzyme Q10 (CO Q 10) 100 MG CAPS Take 100 mg by mouth every evening.       ELIQUIS 5 MG TABS tablet Take 5 mg by mouth 2 (two) times daily.       Ginger, Zingiber officinalis, (GINGER ROOT PO) Take 540 mg by mouth 2 (two) times daily.       glipiZIDE (GLUCOTROL XL) 2.5 MG 24 hr tablet Take 2.5 mg by mouth every morning.       losartan (COZAAR) 25 MG tablet Take 25 mg by mouth in the morning.       metFORMIN (GLUCOPHAGE-XR) 500 MG 24 hr tablet Take 500 mg by mouth every evening.       metoprolol tartrate (LOPRESSOR) 50 MG tablet TAKE 1 TABLET BY MOUTH TWICE DAILY. HOLD IF SYSTOLIC BLOOD PRESSURE( TOP NUMBER) IS LESS THAN 100 MMHG OR PULSE LESS THAN 55 BEATS PER MINUTE 180 tablet 1   Multiple Vitamins-Minerals (MULTIVITAMIN MEN PO) Take 1 tablet by mouth in the morning.       Omega-3 Fatty Acids (ULTRA OMEGA-3 FISH OIL) 1400 MG CAPS Take 1,400 mg by  mouth every evening.       OZEMPIC, 0.25 OR 0.5 MG/DOSE, 2 MG/1.5ML SOPN Inject 0.25 mg into the skin once a week.        No current facility-administered medications for this visit.      Allergies:   Patient has no known allergies.    Social History:  The patient  reports that he has never smoked. He does not have any smokeless tobacco history on file. He reports current alcohol use of about 2.0 standard drinks of alcohol per week. He reports that he does not use drugs.    Family History:  The patient's  family history includes Diabetes in his brother and mother.    ROS:  Please see the history of present illness.   All other systems are personally reviewed and negative.      Exam:     Vital Signs:  BP 118/71   Pulse 76   Ht 6' (1.829 m)   Wt 200 lb (90.7 kg)   BMI 27.12 kg/m    Well appearing, alert and conversant, regular work of breathing,  good skin color Eyes- anicteric, neuro- grossly intact, skin-  no apparent rash or lesions or cyanosis, mouth- oral mucosa is pink     Labs/Other Tests and Data Reviewed:     Recent Labs: 11/29/2021: BUN 15; Creatinine, Ser 1.16; Hemoglobin 15.0; Platelets 228; Potassium 4.3; Sodium 142       Wt Readings from Last 3 Encounters:  08/22/22 200 lb (90.7 kg)  07/22/22 210 lb (95.3 kg)  12/16/21 219 lb (99.3 kg)      Other studies personally reviewed: Additional studies/ records that were reviewed today include: none  Review of the above records today demonstrates: as above Prior radiographs: reviewed   The patient presents wearable device technology report for my review today. On my review, the patient presents with Apple Watch tracings from n/a . The tracings reveal n/a   ASSESSMENT & PLAN:     1.  Recurrent atrial flutter - I have discussed the treatment options with the patient. The risks/benefits/goals/expectations of EP study and ablation were reviewed and he will call us if he wishes to proceed.        Patient Risk:  after full review of this patients clinical status, I feel that they are at moderate risk at this time.   Today, I have spent 20 minutes with the patient with telehealth technology discussing all of the above .     Signed, [name], MD  08/22/2022 8:51 AM     EP Attending  Patient seen and examined. Agree with above. Since last remote visit, no change. He will proceed with EP study and catheter ablation of atrial flutter.   Carleene Overlie Airam Heidecker,MD

## 2022-11-21 NOTE — Interval H&P Note (Signed)
History and Physical Interval Note:  11/21/2022 7:05 AM  Jordan Sharp  has presented today for surgery, with the diagnosis of aflutter.  The various methods of treatment have been discussed with the patient and family. After consideration of risks, benefits and other options for treatment, the patient has consented to  Procedure(s): A-FLUTTER ABLATION (N/A) as a surgical intervention.  The patient's history has been reviewed, patient examined, no change in status, stable for surgery.  I have reviewed the patient's chart and labs.  Questions were answered to the patient's satisfaction.     [name]

## 2022-11-21 NOTE — Discharge Instructions (Signed)

## 2022-11-21 NOTE — Progress Notes (Signed)
Pt ambulated in hallways and up to BR. VSS. No bleeding or hematoma from site. Discharge instructions given to pt and wife verbally and in writing. Both verbalize understanding and deny further questions.  Discharged to home with wife

## 2022-11-21 NOTE — Progress Notes (Signed)
Removed 6,7,9 Fr. Sheath at 0945. Patient hemodynamically stable throughout sheath pull.  Pressure held for 10 min. No complications during sheath pull.  Tegaderm applied. Site level 0.

## 2022-11-21 NOTE — Transfer of Care (Signed)
Immediate Anesthesia Transfer of Care Note  Patient: Jordan Sharp  Procedure(s) Performed: A-FLUTTER ABLATION  Patient Location: PACU and Cath Lab  Anesthesia Type:General  Level of Consciousness: awake and drowsy  Airway & Oxygen Therapy: Patient Spontanous Breathing and Patient connected to nasal cannula oxygen  Post-op Assessment: Report given to RN, Post -op Vital signs reviewed and stable, and Patient moving all extremities X 4  Post vital signs: Reviewed and stable  Last Vitals:  Vitals Value Taken Time  BP    Temp    Pulse 101 11/21/22 0931  Resp 10 11/21/22 0931  SpO2 97 % 11/21/22 0931  Vitals shown include unvalidated device data.  Last Pain:  Vitals:   11/21/22 0627  TempSrc: Temporal  PainSc: 0-No pain      Patients Stated Pain Goal: 3 (09/62/83 6629)  Complications: There were no known notable events for this encounter.

## 2022-11-22 ENCOUNTER — Encounter (HOSPITAL_COMMUNITY): Payer: Self-pay | Admitting: Internal Medicine

## 2022-11-22 NOTE — Anesthesia Postprocedure Evaluation (Signed)
Anesthesia Post Note  Patient: Jordan Sharp  Procedure(s) Performed: A-FLUTTER ABLATION     Patient location during evaluation: Cath Lab Anesthesia Type: General Level of consciousness: awake and alert Pain management: pain level controlled Vital Signs Assessment: post-procedure vital signs reviewed and stable Respiratory status: spontaneous breathing, nonlabored ventilation, respiratory function stable and patient connected to nasal cannula oxygen Cardiovascular status: blood pressure returned to baseline and stable Postop Assessment: no apparent nausea or vomiting Anesthetic complications: no   There were no known notable events for this encounter.  Last Vitals:  Vitals:   11/21/22 1600 11/21/22 1625  BP: (!) 142/94 (!) 146/87  Pulse: (!) 101 (!) 101  Resp: 16 14  Temp:    SpO2: 99% 99%    Last Pain:  Vitals:   11/21/22 1012  TempSrc:   PainSc: 0-No pain                 Santa Lighter

## 2022-12-20 ENCOUNTER — Encounter: Payer: Self-pay | Admitting: Cardiology

## 2022-12-20 ENCOUNTER — Ambulatory Visit: Payer: Medicare Other | Admitting: Cardiology

## 2022-12-20 VITALS — BP 123/68 | HR 60 | Resp 17 | Ht 72.0 in | Wt 197.4 lb

## 2022-12-20 DIAGNOSIS — E1169 Type 2 diabetes mellitus with other specified complication: Secondary | ICD-10-CM

## 2022-12-20 DIAGNOSIS — Z7901 Long term (current) use of anticoagulants: Secondary | ICD-10-CM

## 2022-12-20 DIAGNOSIS — I4892 Unspecified atrial flutter: Secondary | ICD-10-CM

## 2022-12-20 DIAGNOSIS — E1165 Type 2 diabetes mellitus with hyperglycemia: Secondary | ICD-10-CM

## 2022-12-20 DIAGNOSIS — I10 Essential (primary) hypertension: Secondary | ICD-10-CM

## 2022-12-20 NOTE — Progress Notes (Signed)
ID:  Jordan Sharp, DOB [birth year], MRN [id number]  PCP:  Lujean Amel, MD  Cardiologist:  Rex Kras, DO, [facility] (established care 11/09/2021)  Date: 12/20/22 Last Office Visit: 07/22/2022  Chief Complaint  Patient presents with   Atrial Flutter   Follow-up    3 months    HPI  Jordan Sharp is a 67 y.o. Caucasian male whose past medical history and cardiovascular risk factors include: Non-insulin-dependent diabetes mellitus type 2, hypertension, atrial flutter, hypercholesterolemia.   Patient was referred to the practice for atrial flutter management.  He underwent TEE guided cardioversion in February 2023 unfortunately reverted back to atrial flutter at the last office visit.  He was referred to electrophysiology for further recommendations and to consider catheter directed ablation.  Patient establish care with Dr. Lovena Le and had his catheter directed atrial flutter ablation in January 2024.  He has an appointment to see him tomorrow.  Clinically he is doing well postprocedure.  He is slowly increasing his physical activity.  He is walking up to 4 mph with his wife.  He denies anginal discomfort or heart failure symptoms.  He recently had labs with PCP in January 2024.  FUNCTIONAL STATUS: Fasting walking or slow jog 3 to 4.5 miles per day.    ALLERGIES: No Known Allergies   MEDICATION LIST PRIOR TO VISIT: Current Meds  Medication Sig   acetaminophen (TYLENOL) 650 MG CR tablet Take 650 mg by mouth daily as needed for pain.   atorvastatin (LIPITOR) 20 MG tablet Take 20 mg by mouth every evening.   Cholecalciferol (DIALYVITE VITAMIN D 5000) 125 MCG (5000 UT) capsule Take 5,000 Units by mouth daily.   Coenzyme Q10 (CO Q 10) 100 MG CAPS Take 100 mg by mouth every evening.   ELIQUIS 5 MG TABS tablet Take 5 mg by mouth 2 (two) times daily.   Ginger, Zingiber officinalis, (GINGER ROOT PO) Take 540 mg by mouth 2 (two) times daily.   losartan (COZAAR) 25 MG tablet Take 25 mg  by mouth daily.   MELATONIN PO Take 0.5 mg by mouth at bedtime as needed (sleep).   metFORMIN (GLUCOPHAGE-XR) 500 MG 24 hr tablet Take 500 mg by mouth every evening.   metoprolol tartrate (LOPRESSOR) 50 MG tablet TAKE 1 TABLET BY MOUTH TWICE DAILY. HOLD IF SYSTOLIC BLOOD PRESSURE( TOP NUMBER) IS LESS THAN 100 MMHG OR PULSE LESS THAN 55 BEATS PER MINUTE   Multiple Vitamins-Minerals (MULTIVITAMIN MEN PO) Take 1 tablet by mouth in the morning.   Omega-3 Fatty Acids (ULTRA OMEGA-3 FISH OIL) 1400 MG CAPS Take 1,400 mg by mouth every evening.   OZEMPIC, 0.25 OR 0.5 MG/DOSE, 2 MG/1.5ML SOPN Inject 0.5 mg into the skin every Thursday.   Probiotic Product (PROBIOTIC PO) Take 2 capsules by mouth daily.     PAST MEDICAL HISTORY: Past Medical History:  Diagnosis Date   Atrial flutter ([place])    Diabetes ([place])    Hyperlipidemia    Hypertension     PAST SURGICAL HISTORY: Past Surgical History:  Procedure Laterality Date   A-FLUTTER ABLATION N/A 11/21/2022   Procedure: A-FLUTTER ABLATION;  Surgeon: Evans Lance, MD;  Location: [facility];  Service: Cardiovascular;  Laterality: N/A;   BUBBLE STUDY  11/30/2021   Procedure: BUBBLE STUDY;  Surgeon: Rex Kras, DO;  Location: [facility];  Service: Cardiovascular;;   CARDIOVERSION N/A 11/30/2021   Procedure: CARDIOVERSION;  Surgeon: Rex Kras, DO;  Location: [facility];  Service: Cardiovascular;  Laterality: N/A;  TEE WITHOUT CARDIOVERSION N/A 11/30/2021   Procedure: TRANSESOPHAGEAL ECHOCARDIOGRAM (TEE);  Surgeon: Rex Kras, DO;  [facility];  Service: Cardiovascular;  Laterality: N/A;    FAMILY HISTORY: The patient family history includes Diabetes in his brother and mother.  SOCIAL HISTORY:  The patient  reports that he has never smoked. He does not have any smokeless tobacco history on file. He reports current alcohol use of about 2.0 standard drinks of alcohol per week. He reports that he does not use drugs.  REVIEW OF  SYSTEMS: Review of Systems  Cardiovascular:  Negative for chest pain, dyspnea on exertion, leg swelling, orthopnea, palpitations, paroxysmal nocturnal dyspnea and syncope.  Respiratory:  Negative for shortness of breath.     PHYSICAL EXAM:    12/20/2022   10:06 AM 11/21/2022    4:25 PM 11/21/2022    4:00 PM  Vitals with BMI  Height '6\' 0"'$     Weight 197 lbs 6 oz    BMI [id number]    Systolic [id number]  Diastolic 68 87 94  Pulse 60 101 101   Physical Exam  Constitutional: No distress.  Age appropriate, hemodynamically stable.   Neck: No JVD present.  Cardiovascular: Regular rhythm, S1 normal, S2 normal, intact distal pulses and normal pulses. Bradycardia present. Exam reveals no gallop, no S3 and no S4.  No murmur heard. Pulmonary/Chest: Effort normal and breath sounds normal. No stridor. He has no wheezes. He has no rales.  Abdominal: Soft. Bowel sounds are normal. He exhibits no distension. There is no abdominal tenderness.  Musculoskeletal:        General: No edema.     Cervical back: Neck supple.  Neurological: He is alert and oriented to person, place, and time. He has intact cranial nerves (2-12).  Skin: Skin is warm and moist.   CARDIAC DATABASE: TEE guided DDCV:  11/30/2021 : 150 J x 1 converted to sinus tachycardia.  Atrial flutter ablation 11/21/2022 with Dr. Lovena Le  EKG: 11/09/2021: Atrial flutter with variable conduction, 82 bpm, consider old inferior infarct.  12/16/2021: Sinus bradycardia, 58 bpm, without underlying ischemia injury pattern. 07/22/2022: Atrial flutter 4:1, 76bpm, without underlying injury pattern.  12/20/2022: Sinus bradycardia, 59 bpm, nonspecific T wave abnormality.  Echocardiogram: TEE 11/30/2021:  1. Left ventricular ejection fraction, by estimation, is 60 to 65%. The  left ventricle has normal function. The left ventricle has no regional  wall motion abnormalities. Left ventricular diastolic function could not  be evaluated.   2. Right  ventricular systolic function is normal. The right ventricular  size is normal.   3. No left atrial/left atrial appendage thrombus was detected. The LAA  emptying velocity was 47 cm/s.   4. The pericardial effusion is anterior to the right ventricle.   5. The mitral valve is normal in structure. Mild mitral valve  regurgitation. No evidence of mitral stenosis.   6. The aortic valve is tricuspid. Aortic valve regurgitation is not  visualized. No aortic stenosis is present.   7. Agitated saline contrast bubble study was negative, with no evidence  of any interatrial shunt.   Lower extremity venous duplex 11/02/2021 No evidence of deep venous thrombosis  LABORATORY DATA:    Latest Ref Rng & Units 11/29/2021    8:07 AM  CBC  WBC 3.4 - 10.8 x10E3/uL 5.4   Hemoglobin 13.0 - 17.7 g/dL 15.0   Hematocrit 37.5 - 51.0 % 43.3   Platelets 150 - 450 x10E3/uL 228  Latest Ref Rng & Units 11/29/2021    8:07 AM  CMP  Glucose 70 - 99 mg/dL 147   BUN 8 - 27 mg/dL 15   Creatinine 0.76 - 1.27 mg/dL 1.16   Sodium 134 - 144 mmol/L 142   Potassium 3.5 - 5.2 mmol/L 4.3   Chloride 96 - 106 mmol/L 104   CO2 20 - 29 mmol/L 21   Calcium 8.6 - 10.2 mg/dL 9.3     Lipid Panel  No results found for: "CHOL", "TRIG", "HDL", "CHOLHDL", "VLDL", "LDLCALC", "LDLDIRECT", "LABVLDL"  No components found for: "NTPROBNP" No results for input(s): "PROBNP" in the last 8760 hours. No results for input(s): "TSH" in the last 8760 hours.  BMP No results for input(s): "NA", "K", "CL", "CO2", "GLUCOSE", "BUN", "CREATININE", "CALCIUM", "GFRNONAA", "GFRAA" in the last 8760 hours.   HEMOGLOBIN A1C No results found for: "HGBA1C", "MPG"  External Labs: Collected: 10/29/2021. A1c 8.7 Hemoglobin 15.6 g/dL, hematocrit 45.9% BUN 17, creatinine 1.05 mg/dL Sodium 137, potassium 4.7, chloride 99, bicarb 23, AST 13, ALT 17, alkaline phosphatase 56 Total cholesterol 186, triglycerides 130, HDL 54, LDL 109, non-HDL  132  External Labs: Collected: 10/29/2021 provided by PCP A1c 8.7. Hemoglobin 15.6 g/dL, hematocrit 45.9% BUN 17, creatinine 1.05 mg/dL. Sodium 137, potassium 4.7, chloride 99, bicarb 23. AST 13, ALT 17, alkaline phosphatase 56 Total cholesterol 196, triglycerides 130, HDL 54, LDL 109 non-HDL 132  External Labs: Collected: 11/02/2022. TSH 1.54. Total cholesterol 141, triglycerides 80, NHDL 81, LDL calculated 66, HDL 60 BUN 20, creatinine 1. Sodium 140, potassium 4.1, chloride 105, bicarb 28. AST 16, ALT 17, alkaline phosphatase 45  IMPRESSION:    ICD-10-CM   1. Paroxysmal atrial flutter (HCC)  I48.92 EKG 12-Lead    2. Long term (current) use of anticoagulants  Z79.01     3. Type 2 diabetes mellitus with hyperglycemia, without long-term current use of insulin (HCC)  E11.65     4. Type 2 diabetes mellitus with hyperlipidemia (HCC)  E11.69    E78.5     5. Benign hypertension  I10        RECOMMENDATIONS: Jordan Sharp is a 67 y.o. Caucasian male whose past medical history and cardiac risk factors include: Non-insulin-dependent diabetes mellitus type 2, hypertension, paroxysmal atrial flutter, hypercholesterolemia.   Paroxysmal atrial flutter (HCC) EKG illustrates sinus bradycardia with nonspecific T wave Rate control: Metoprolol. Rhythm control: N/A. Thromboembolic prophylaxis: Eliquis. Underwent TEE guided cardioversion in February 2023 150 J x 1. Atrial flutter ablation January 2024 with Dr. Lovena Le CHA2DS2-VASc SCORE is 3 which correlates to 3.2% risk of stroke per year (DM, HTN, age).  He follows up with Dr. Lovena Le tomorrow.  I have asked him to discuss the duration of anticoagulation since he is a status post ablation  Type 2 diabetes mellitus with hyperglycemia, without long-term current use of insulin ([place]) Reemphasized importance of glycemic control. Currently managed by primary care provider.  Type 2 diabetes mellitus with hyperlipidemia (HCC) Currently on  atorvastatin.   He denies myalgia or other side effects. Most recent lipids dated January 2024, independently reviewed as noted above Patient and wife state that the lipids are well-controlled.   Will defer to PCP  Benign hypertension Office blood pressures are very well-controlled. No medication changes warranted at this time.  Patient is congratulated as he has lost weight since last office visit.  I would like to see him back on an annual basis sooner if needed.  FINAL MEDICATION LIST END OF ENCOUNTER: No orders  of the defined types were placed in this encounter.   There are no discontinued medications.    Current Outpatient Medications:    acetaminophen (TYLENOL) 650 MG CR tablet, Take 650 mg by mouth daily as needed for pain., Disp: , Rfl:    atorvastatin (LIPITOR) 20 MG tablet, Take 20 mg by mouth every evening., Disp: , Rfl:    Cholecalciferol (DIALYVITE VITAMIN D 5000) 125 MCG (5000 UT) capsule, Take 5,000 Units by mouth daily., Disp: , Rfl:    Coenzyme Q10 (CO Q 10) 100 MG CAPS, Take 100 mg by mouth every evening., Disp: , Rfl:    ELIQUIS 5 MG TABS tablet, Take 5 mg by mouth 2 (two) times daily., Disp: , Rfl:    Ginger, Zingiber officinalis, (GINGER ROOT PO), Take 540 mg by mouth 2 (two) times daily., Disp: , Rfl:    losartan (COZAAR) 25 MG tablet, Take 25 mg by mouth daily., Disp: , Rfl:    MELATONIN PO, Take 0.5 mg by mouth at bedtime as needed (sleep)., Disp: , Rfl:    metFORMIN (GLUCOPHAGE-XR) 500 MG 24 hr tablet, Take 500 mg by mouth every evening., Disp: , Rfl:    metoprolol tartrate (LOPRESSOR) 50 MG tablet, TAKE 1 TABLET BY MOUTH TWICE DAILY. HOLD IF SYSTOLIC BLOOD PRESSURE( TOP NUMBER) IS LESS THAN 100 MMHG OR PULSE LESS THAN 55 BEATS PER MINUTE, Disp: 180 tablet, Rfl: 1   Multiple Vitamins-Minerals (MULTIVITAMIN MEN PO), Take 1 tablet by mouth in the morning., Disp: , Rfl:    Omega-3 Fatty Acids (ULTRA OMEGA-3 FISH OIL) 1400 MG CAPS, Take 1,400 mg by mouth every  evening., Disp: , Rfl:    OZEMPIC, 0.25 OR 0.5 MG/DOSE, 2 MG/1.5ML SOPN, Inject 0.5 mg into the skin every Thursday., Disp: , Rfl:    Probiotic Product (PROBIOTIC PO), Take 2 capsules by mouth daily., Disp: , Rfl:   Orders Placed This Encounter  Procedures   EKG 12-Lead   --Continue cardiac medications as reconciled in final medication list. --Return in about 1 year (around 12/21/2023) for Follow up paroxysmal atrial flutter. Or sooner if needed. --Continue follow-up with your primary care physician regarding the management of your other chronic comorbid conditions.  Patient's questions and concerns were addressed to his satisfaction. He voices understanding of the instructions provided during this encounter.   This note was created using a voice recognition software as a result there may be grammatical errors inadvertently enclosed that do not reflect the nature of this encounter. Every attempt is made to correct such errors  Mechele Claude [facility]  Pager: [phone] Office: [phone]

## 2022-12-21 ENCOUNTER — Encounter: Payer: Self-pay | Admitting: Internal Medicine

## 2022-12-21 ENCOUNTER — Ambulatory Visit: Payer: Medicare Other | Attending: Internal Medicine | Admitting: Internal Medicine

## 2022-12-21 VITALS — BP 120/78 | HR 56 | Ht 72.0 in | Wt 196.8 lb

## 2022-12-21 DIAGNOSIS — I10 Essential (primary) hypertension: Secondary | ICD-10-CM

## 2022-12-21 DIAGNOSIS — I483 Typical atrial flutter: Secondary | ICD-10-CM | POA: Insufficient documentation

## 2022-12-21 NOTE — Patient Instructions (Addendum)
Medication Instructions:  Your physician recommends that you continue on your current medications as directed. Please refer to the Current Medication list given to you today.  *If you need a refill on your cardiac medications before your next appointment, please call your pharmacy*  Lab Work: None ordered.  If you have labs (blood work) drawn today and your tests are completely normal, you will receive your results only by: [place] (if you have MyChart) OR A paper copy in the mail If you have any lab test that is abnormal or we need to change your treatment, we will call you to review the results.  Testing/Procedures: None ordered.  Follow-Up: At [facility], you and your health needs are our priority.  As part of our continuing mission to provide you with exceptional heart care, we have created designated Provider Care Teams.  These Care Teams include your primary Cardiologist (physician) and Advanced Practice Providers (APPs -  Physician Assistants and Nurse Practitioners) who all work together to provide you with the care you need, when you need it.  We recommend signing up for the patient portal called "MyChart".  Sign up information is provided on this After Visit Summary.  MyChart is used to connect with patients for Virtual Visits (Telemedicine).  Patients are able to view lab/test results, encounter notes, upcoming appointments, etc.  Non-urgent messages can be sent to your provider as well.   To learn more about what you can do with MyChart, go to [email or web address].    Your next appointment:   AS NEEDED  The format for your next appointment:   In Person  Provider:   [name], MD{or one of the following Advanced Practice Providers on your designated Care Team:   Tommye Standard, [place] Legrand Como "[facility]" [place], [place]

## 2022-12-21 NOTE — Progress Notes (Signed)
HPI Jordan Sharp returns today for followup. He is a pleasant 67 yo man with a h/o HTN, and dyslipidemia who developed atrial flutter and underwent EP study and ablation about 4 weeks ago. He was found to have typical atrial flutter and underwent successful ablation. He has done well in the interim. He denies chest pain and sob. He has started back walking.  No Known Allergies   Current Outpatient Medications  Medication Sig Dispense Refill   acetaminophen (TYLENOL) 650 MG CR tablet Take 650 mg by mouth daily as needed for pain.     atorvastatin (LIPITOR) 20 MG tablet Take 20 mg by mouth every evening.     Cholecalciferol (DIALYVITE VITAMIN D 5000) 125 MCG (5000 UT) capsule Take 5,000 Units by mouth daily.     Coenzyme Q10 (CO Q 10) 100 MG CAPS Take 100 mg by mouth every evening.     ELIQUIS 5 MG TABS tablet Take 5 mg by mouth 2 (two) times daily.     Ginger, Zingiber officinalis, (GINGER ROOT PO) Take 540 mg by mouth 2 (two) times daily.     losartan (COZAAR) 25 MG tablet Take 25 mg by mouth daily.     MELATONIN PO Take 0.5 mg by mouth at bedtime as needed (sleep).     metFORMIN (GLUCOPHAGE-XR) 500 MG 24 hr tablet Take 500 mg by mouth every evening.     metoprolol tartrate (LOPRESSOR) 50 MG tablet TAKE 1 TABLET BY MOUTH TWICE DAILY. HOLD IF SYSTOLIC BLOOD PRESSURE( TOP NUMBER) IS LESS THAN 100 MMHG OR PULSE LESS THAN 55 BEATS PER MINUTE (Patient taking differently: Take 50 mg by mouth as needed.) 180 tablet 1   Multiple Vitamins-Minerals (MULTIVITAMIN MEN PO) Take 1 tablet by mouth in the morning.     Omega-3 Fatty Acids (ULTRA OMEGA-3 FISH OIL) 1400 MG CAPS Take 1,400 mg by mouth every evening.     OZEMPIC, 0.25 OR 0.5 MG/DOSE, 2 MG/1.5ML SOPN Inject 0.5 mg into the skin every Thursday.     Probiotic Product (PROBIOTIC PO) Take 2 capsules by mouth daily.     No current facility-administered medications for this visit.     Past Medical History:  Diagnosis Date   Atrial flutter  ([place])    Diabetes ([place])    Hyperlipidemia    Hypertension     ROS:   All systems reviewed and negative except as noted in the HPI.   Past Surgical History:  Procedure Laterality Date   A-FLUTTER ABLATION N/A 11/21/2022   Procedure: A-FLUTTER ABLATION;  Surgeon: Evans Lance, MD;  Location: [facility];  Service: Cardiovascular;  Laterality: N/A;   BUBBLE STUDY  11/30/2021   Procedure: BUBBLE STUDY;  Surgeon: Rex Kras, DO;  Location: [facility];  Service: Cardiovascular;;   CARDIOVERSION N/A 11/30/2021   Procedure: CARDIOVERSION;  Surgeon: Rex Kras, DO;  [facility];  Service: Cardiovascular;  Laterality: N/A;   TEE WITHOUT CARDIOVERSION N/A 11/30/2021   Procedure: TRANSESOPHAGEAL ECHOCARDIOGRAM (TEE);  Surgeon: Rex Kras, DO;  [facility];  Service: Cardiovascular;  Laterality: N/A;     Family History  Problem Relation Age of Onset   Diabetes Mother    Diabetes Brother      Social History   Socioeconomic History   Marital status: Married    Spouse name: Not on file   Number of children: 2   Years of education: Not on file   Highest education level: Not on file  Occupational History   Not on file  Tobacco Use   Smoking status: Never   Smokeless tobacco: Not on file  Vaping Use   Vaping Use: Never used  Substance and Sexual Activity   Alcohol use: Yes    Alcohol/week: 2.0 standard drinks of alcohol    Types: 1 Glasses of wine, 1 Shots of liquor per week    Comment: 3 times a week   Drug use: No   Sexual activity: Not on file  Other Topics Concern   Not on file  Social History Narrative   Not on file   Social Determinants of Health   Financial Resource Strain: Not on file  Food Insecurity: Not on file  Transportation Needs: Not on file  Physical Activity: Not on file  Stress: Not on file  Social Connections: Not on file  Intimate Partner Violence: Not on file     BP 120/78   Pulse (!) 56   Ht 6' (1.829 m)   Wt  196 lb 12.8 oz (89.3 kg)   SpO2 99%   BMI 26.69 kg/m   Physical Exam:  Well appearing NAD HEENT: Unremarkable Neck:  No JVD, no thyromegally Lymphatics:  No adenopathy Back:  No CVA tenderness Lungs:  Clear HEART:  Regular rate rhythm, no murmurs, no rubs, no clicks Abd:  soft, positive bowel sounds, no organomegally, no rebound, no guarding Ext:  2 plus pulses, no edema, no cyanosis, no clubbing Skin:  No rashes no nodules Neuro:  CN II through XII intact, motor grossly intact  EKG - nsr  Assess/Plan: Atrial flutter - he is s/p EP study and catheter ablation. Coags - he will stop his [employer]. HTN - his bp is controlled. No change in meds.  Carleene Overlie Ticara Waner,MD

## 2023-02-13 ENCOUNTER — Encounter: Payer: Self-pay | Admitting: Cardiology

## 2023-04-11 NOTE — Telephone Encounter (Signed)
See essage

## 2023-08-07 IMAGING — US US EXTREM LOW VENOUS*L*
1 series · 13 of 24 positions shown · non-contrast
Comparison: None.

CLINICAL DATA: Left thigh pain



[Series 1: us venous img lower uni left (dvt) · portal-venous · 13 of 32 slices shown]
[im 1/32]
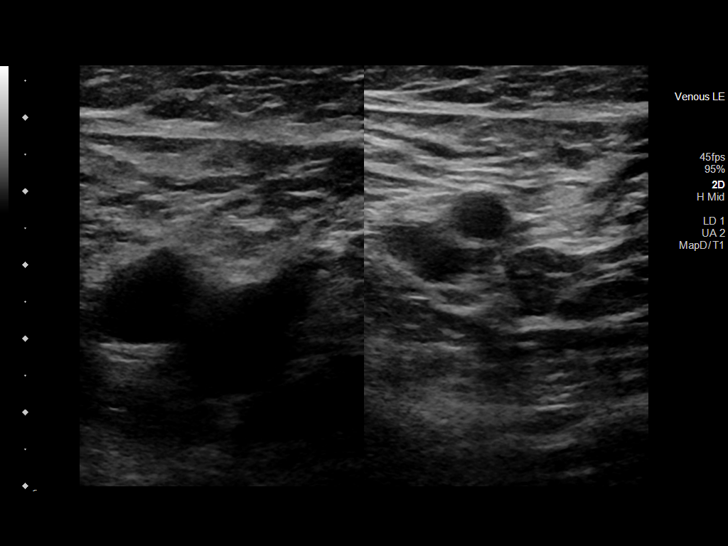
[im 3/32]
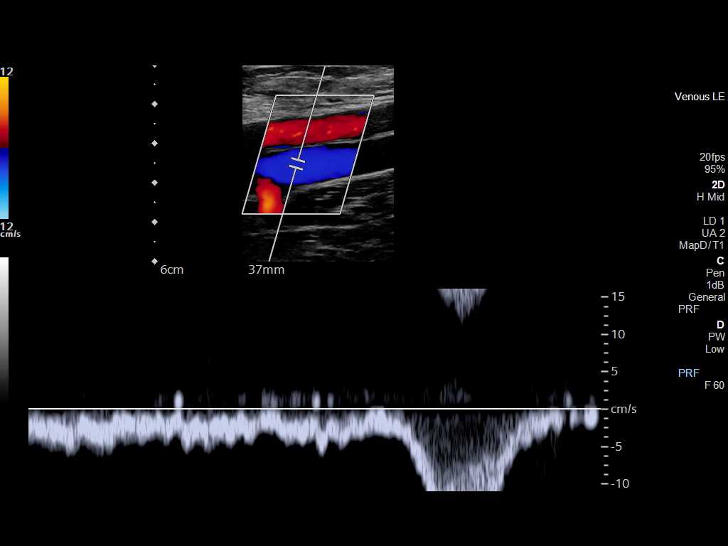
[im 6/32]
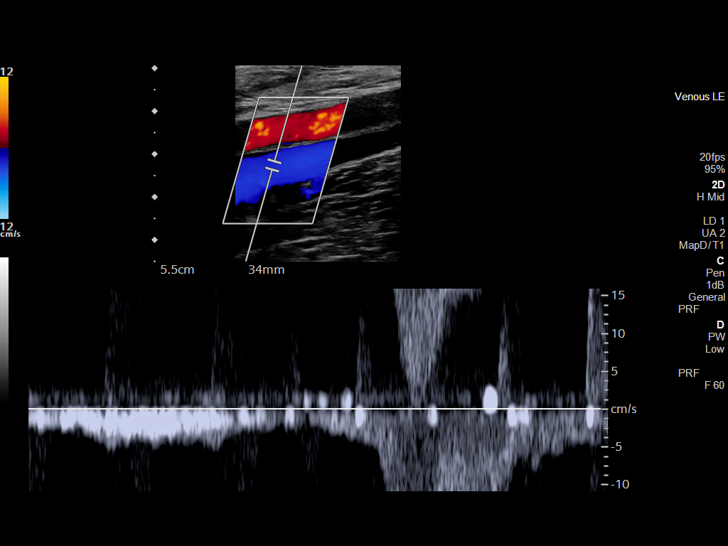
[im 9/32]
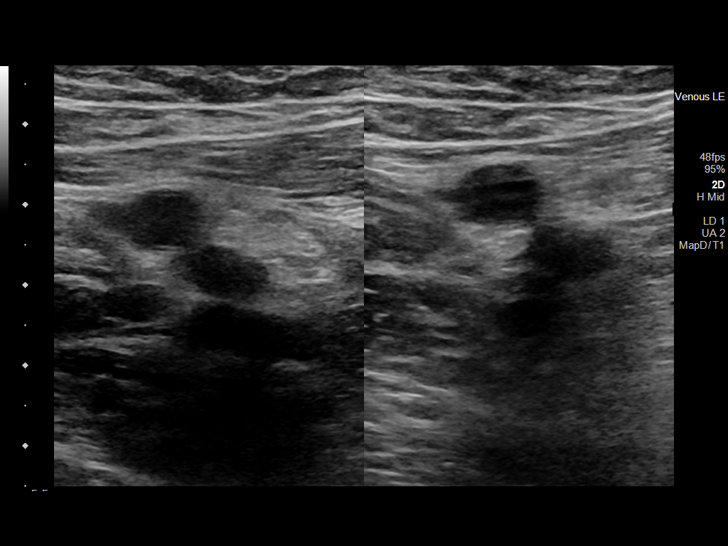
[im 11/32]
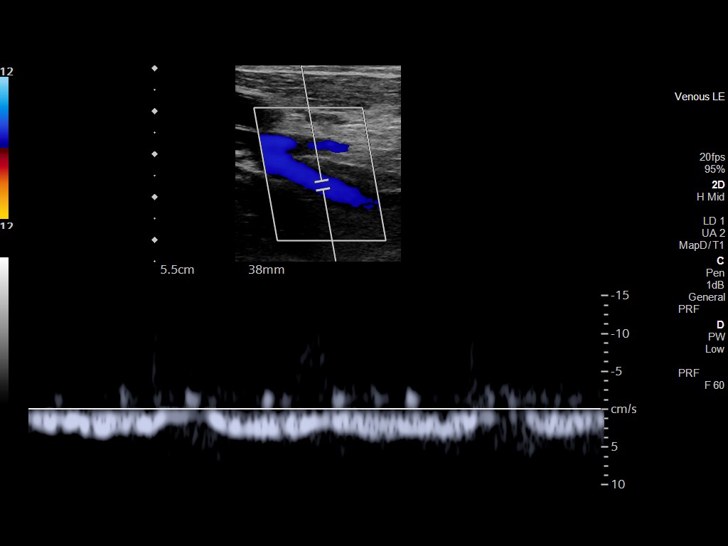
[im 14/32]
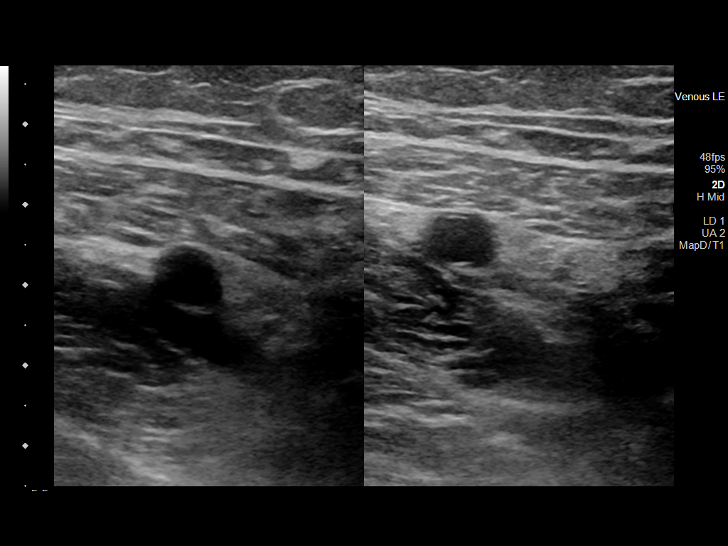
[im 17/32]
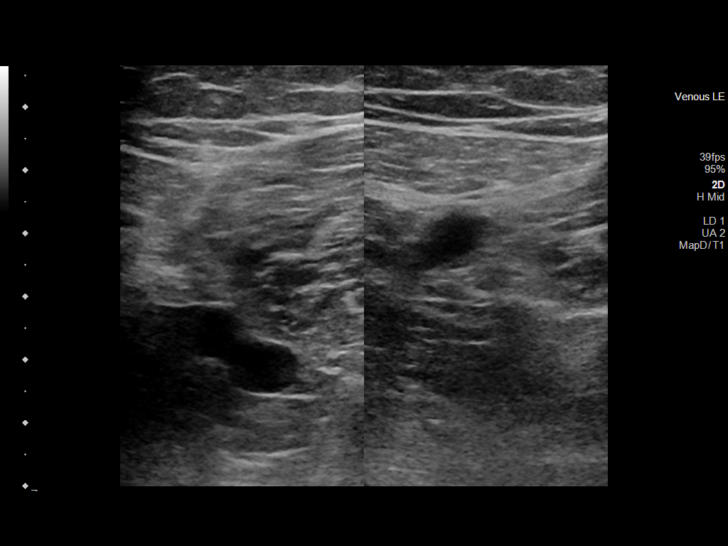
[im 18/32]
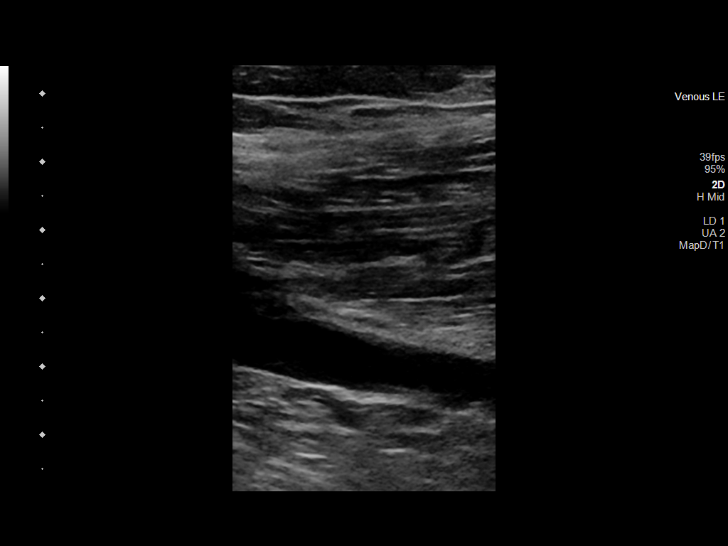
[im 21/32]
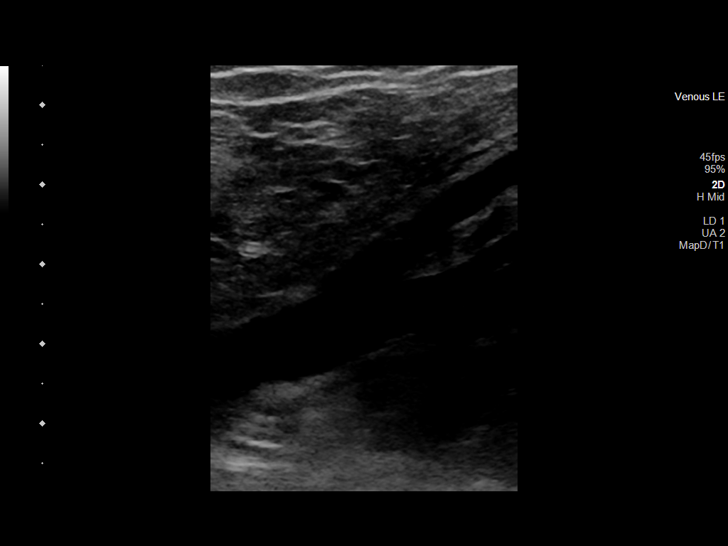
[im 23/32]
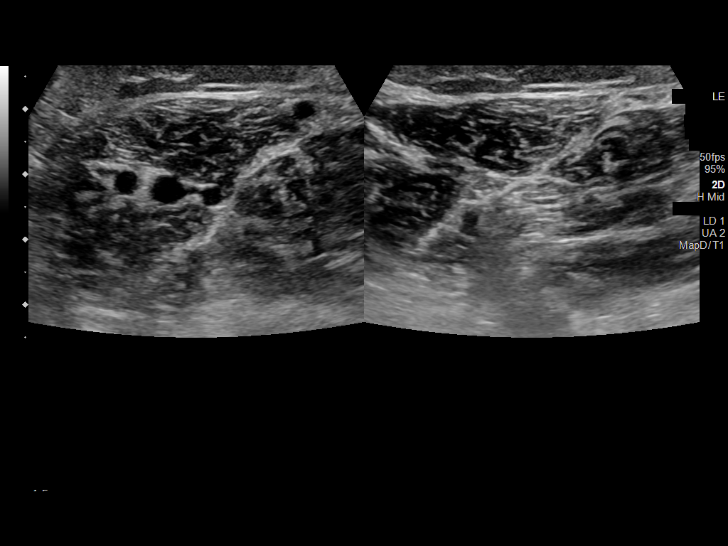
[im 26/32]
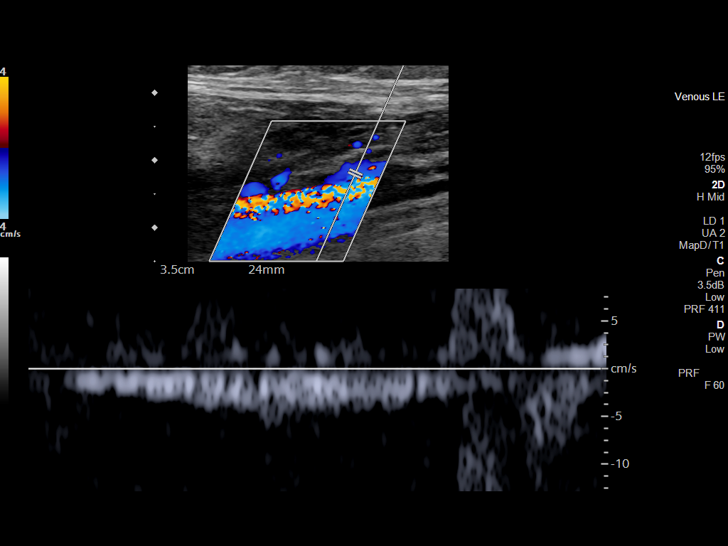
[im 29/32]
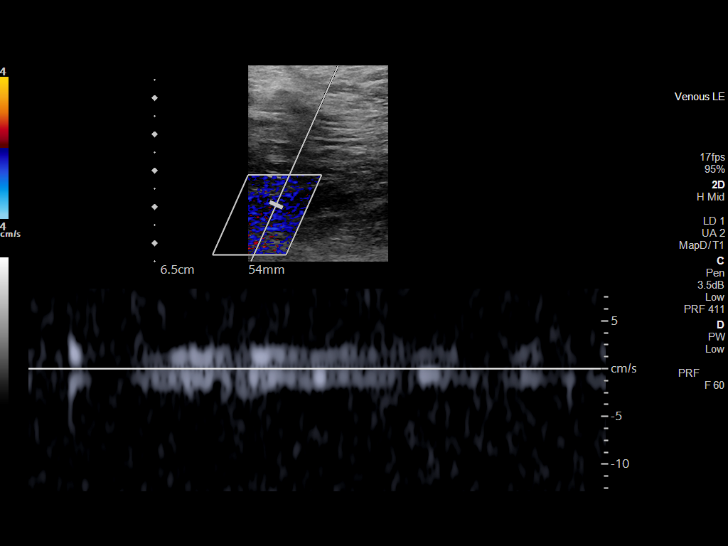
[im 32/32]
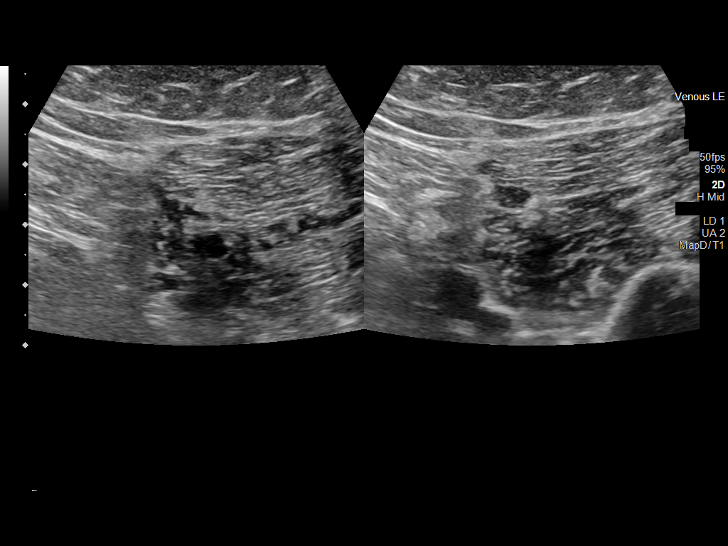

[13 of 24 positions shown; findings below may reference images not displayed]

FINDINGS: Contralateral Common Femoral Vein: Respiratory phasicity is normal
and symmetric with the symptomatic side. No evidence of thrombus.
Normal compressibility.

Common Femoral Vein: No evidence of thrombus. Normal
compressibility, respiratory phasicity and response to augmentation.

Saphenofemoral Junction: No evidence of thrombus. Normal
compressibility and flow on color Doppler imaging.

Profunda Femoral Vein: No evidence of thrombus. Normal
compressibility and flow on color Doppler imaging.

Femoral Vein: No evidence of thrombus. Normal compressibility,
respiratory phasicity and response to augmentation.

Popliteal Vein: No evidence of thrombus. Normal compressibility,
respiratory phasicity and response to augmentation.

Calf Veins: No evidence of thrombus. Normal compressibility and flow
on color Doppler imaging.

Other Findings:  None.
IMPRESSION: No evidence of deep venous thrombosis.

## 2023-11-13 NOTE — Progress Notes (Signed)
[facility] - Clinic Note  11/15/2023   CHIEF COMPLAINT Patient presents for Retina Evaluation  HISTORY OF PRESENT ILLNESS: Jordan Sharp is a 68 y.o. male who presents to the clinic today for:  HPI     Retina Evaluation   In both eyes.  I, the attending physician,  performed the HPI with the patient and updated documentation appropriately.        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 Zenaida Niece. Patient is also diabetic for about 15 years. Takes metformin 500 mg twice daily and ozempic injection once a week. BS around 100 in am for the past month. Last A1c was 6.3 in July 2024 . Had CE with IOL OU this past year with Dr. Zenaida Niece. Patient states sees good with both eyes open.      Last edited by Rennis Chris, MD on 11/15/2023  4:03 PM.    Patient states that he has recently seen Dr. Zenaida Niece for a 6 month post operative from cataract surgery. Dr. Zenaida Niece just saw the nevus. Patient states his mother passed away from brain cancer. That is the only person in his family that had cancer. He feels the vision is doing well.   Referring physician: Diona Foley, MD [address] [place],  [place] [address]  HISTORICAL INFORMATION:  Selected notes from the MEDICAL RECORD NUMBER Referred by Dr. Zenaida Niece for evaluation of choroidal lesion - nevus vs melanoma LEE:  Ocular Hx- PMH-   CURRENT MEDICATIONS: No current outpatient medications on file. (Ophthalmic Drugs)   No current facility-administered medications for this visit. (Ophthalmic Drugs)   Current Outpatient Medications (Other)  Medication Sig   acetaminophen (TYLENOL) 650 MG CR tablet Take 650 mg by mouth daily as needed for pain.   atorvastatin (LIPITOR) 20 MG tablet Take 20 mg by mouth every evening.   Cholecalciferol (DIALYVITE VITAMIN D 5000) 125 MCG (5000 UT) capsule Take 5,000 Units by mouth daily.   Coenzyme Q10 (CO Q 10) 100 MG CAPS Take 100 mg by mouth every evening.   Ginger,  Zingiber officinalis, (GINGER ROOT PO) Take 540 mg by mouth 2 (two) times daily.   losartan (COZAAR) 25 MG tablet Take 25 mg by mouth daily.   MELATONIN PO Take 0.5 mg by mouth at bedtime as needed (sleep).   metFORMIN (GLUCOPHAGE-XR) 500 MG 24 hr tablet Take 500 mg by mouth every evening.   metoprolol tartrate (LOPRESSOR) 50 MG tablet TAKE 1 TABLET BY MOUTH TWICE DAILY. HOLD IF SYSTOLIC BLOOD PRESSURE( TOP NUMBER) IS LESS THAN 100 MMHG OR PULSE LESS THAN 55 BEATS PER MINUTE (Patient taking differently: Take 50 mg by mouth as needed.)   Multiple Vitamins-Minerals (MULTIVITAMIN MEN PO) Take 1 tablet by mouth in the morning.   Omega-3 Fatty Acids (ULTRA OMEGA-3 FISH OIL) 1400 MG CAPS Take 1,400 mg by mouth every evening.   OZEMPIC, 0.25 OR 0.5 MG/DOSE, 2 MG/1.5ML SOPN Inject 0.5 mg into the skin every Thursday.   Probiotic Product (PROBIOTIC PO) Take 2 capsules by mouth daily.   ELIQUIS 5 MG TABS tablet Take 5 mg by mouth 2 (two) times daily. (Patient not taking: Reported on 11/15/2023)   No current facility-administered medications for this visit. (Other)   REVIEW OF SYSTEMS: ROS   Positive for: Endocrine, Cardiovascular, Eyes Negative for: Constitutional, Gastrointestinal, Neurological, Skin, Genitourinary, Musculoskeletal, HENT, Respiratory, Psychiatric, Allergic/Imm, Heme/Lymph Last edited by Annalee Genta D, COT on 11/15/2023  1:04 PM.  ALLERGIES No Known Allergies PAST MEDICAL HISTORY Past Medical History:  Diagnosis Date   Atrial flutter (HCC)    Diabetes (HCC)    Hyperlipidemia    Hypertension    Past Surgical History:  Procedure Laterality Date   A-FLUTTER ABLATION N/A 11/21/2022   Procedure: A-FLUTTER ABLATION;  Surgeon: Marinus Maw, MD;  Location: [facility] INVASIVE CV LAB;  Service: Cardiovascular;  Laterality: N/A;   BUBBLE STUDY  11/30/2021   Procedure: BUBBLE STUDY;  Surgeon: Tessa Lerner, DO;  Location: [facility] ENDOSCOPY;  Service: Cardiovascular;;   CARDIOVERSION N/A  11/30/2021   Procedure: CARDIOVERSION;  Surgeon: Tessa Lerner, DO;  Location: [facility] ENDOSCOPY;  Service: Cardiovascular;  Laterality: N/A;   CATARACT EXTRACTION     TEE WITHOUT CARDIOVERSION N/A 11/30/2021   Procedure: TRANSESOPHAGEAL ECHOCARDIOGRAM (TEE);  Surgeon: Tessa Lerner, DO;  Location: [facility] ENDOSCOPY;  Service: Cardiovascular;  Laterality: N/A;   FAMILY HISTORY Family History  Problem Relation Age of Onset   Diabetes Mother    Diabetes Brother    SOCIAL HISTORY Social History   Tobacco Use   Smoking status: Never  Vaping Use   Vaping status: Never Used  Substance Use Topics   Alcohol use: Yes    Alcohol/week: 2.0 standard drinks of alcohol    Types: 1 Glasses of wine, 1 Shots of liquor per week    Comment: 3 times a week   Drug use: No       OPHTHALMIC EXAM:  Base Eye Exam     Visual Acuity (Snellen - Linear)       Right Left   Dist [place] 20/50 -1 20/25 -1   Dist ph [place] 20/20 20/20    Correction: Glasses         Tonometry (Tonopen, 1:16 PM)       Right Left   Pressure 10 12         Pupils       Dark Light Shape React APD   Right 4 3 Round Brisk None   Left 4 3 Round Brisk None         Visual Fields (Counting fingers)       Left Right    Full Full         Extraocular Movement       Right Left    Full, Ortho Full, Ortho         Neuro/Psych     Oriented x3: Yes   Mood/Affect: Normal         Dilation     Both eyes: 1.0% Mydriacyl, 2.5% Phenylephrine @ 1:16 PM           Slit Lamp and Fundus Exam     External Exam       Right Left   External Normal Normal         Slit Lamp Exam       Right Left   Lids/Lashes Dermatochalasis - upper lid, Meibomian gland dysfunction, lid ptosis Dermatochalasis - upper lid, Meibomian gland dysfunction   Conjunctiva/Sclera White and quiet White and quiet   Cornea trace fine endopigment, Well healed temporal cataract wound Well healed temporal cataract wound, Debris in tear film    Anterior Chamber Deep and clear Deep and clear   Iris Round and dilated, No NVI Round and dilated, No NVI   Lens PC IOL in good postition PC IOL in good postition   Anterior Vitreous Vitreous syneresis Vitreous syneresis         Fundus  Exam       Right Left   Disc Pink and sharp, Compact Pink and sharp   C/D Ratio 0.1 0.4   Macula Flat, Good foveal reflex mild RPE mottling, No heme or edema Flat, Good foveal reflex, RPE mottling, No heme or edema   Vessels Vascular attenuation, Tortuous Vascular attenuation, Tortuous   Periphery Attached, No heme Attached, pigmented choroidal lesion SN mid zone approx. 4dd in size, mild drusen, no orange pigment, shallow SRF overlying, minimal elevation, focal IRH superior to disc           Refraction     Manifest Refraction       Sphere Cylinder Axis Dist VA   Right -0.50 +1.50 037 20/20   Left -1.00 +0.75 145 20/20           IMAGING AND PROCEDURES  Imaging and Procedures for 11/15/2023  OCT, Retina - OU - Both Eyes       Right Eye Central Foveal Thickness: 282. Progression has no prior data. Findings include normal foveal contour, no IRF, no SRF, vitreomacular adhesion .   Left Eye Central Foveal Thickness: 274. Progression has no prior data. Findings include normal foveal contour, vitreomacular adhesion (No IRF/SRF centrally, focal hyper reflective chordal lesion along SN arcades w/ + IRF and shallow SRF overlying).   Notes *Images captured and stored on drive  Diagnosis / Impression:  No DME OU OS: No IRF/SRF centrally, focal hyper-reflective chordal lesion along SN arcades w/ +IRF and shallow SRF overlying, ~500-600 um thick  Clinical management:  See below  Abbreviations: NFP - Normal foveal profile. CME - cystoid macular edema. PED - pigment epithelial detachment. IRF - intraretinal fluid. SRF - subretinal fluid. EZ - ellipsoid zone. ERM - epiretinal membrane. ORA - outer retinal atrophy. ORT - outer retinal tubulation.  SRHM - subretinal hyper-reflective material. IRHM - intraretinal hyper-reflective material      Color Fundus Photography Optos - OU - Both Eyes       Right Eye Progression has no prior data. Disc findings include normal observations. Macula : normal observations. Vessels : normal observations. Periphery : normal observations.   Left Eye Progression has no prior data. Disc findings include normal observations. Macula : normal observations. Vessels : normal observations. Periphery : RPE abnormality (Pigmented choroidal lesion w/ + drusen SN mid zone approx. 4dd in size, no heme or orange pigment).   Notes *Images captured and stored on drive  Diagnosis / Impression:  OS: Pigmented choroidal lesion w/ + drusen SN mid zone approx. ~4DD in size, no heme or orange pigment  Clinical management:  See below  Abbreviations: NFP - Normal foveal profile. CME - cystoid macular edema. PED - pigment epithelial detachment. IRF - intraretinal fluid. SRF - subretinal fluid. EZ - ellipsoid zone. ERM - epiretinal membrane. ORA - outer retinal atrophy. ORT - outer retinal tubulation. SRHM - subretinal hyper-reflective material. IRHM - intraretinal hyper-reflective material           ASSESSMENT/PLAN:   ICD-10-CM   1. Choroidal lesion  H31.8 OCT, Retina - OU - Both Eyes    Color Fundus Photography Optos - OU - Both Eyes    2. 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 Diabetes mellitus treated with oral medication (HCC)  E11.9    Z79.84     4. Diabetes mellitus treated with injections of non-insulin medication (HCC)  E11.9    Z79.85     5. Hypertensive  retinopathy of both eyes  H35.033     6. Essential hypertension  I10     7. Pseudophakia of both eyes  Z96.1      1. 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 Pearletha Furl  - appointment scheduled for 01.25.25   - f/u here in 6 mos, sooner prn  2-4. Diabetes mellitus, type 2 without retinopathy  - A1C 6.3 in July 2024  - BCVA 20/20 OU  - The incidence, risk factors for progression, natural history and treatment options for diabetic retinopathy  were discussed with patient.   - The need for close monitoring of blood glucose, blood pressure, and serum lipids, avoiding cigarette or any type of tobacco, and the need for long term follow up was also discussed with patient. - f/u in 6 months, DFE, OCT, sooner prn    5,6. Hypertensive retinopathy OU - discussed importance of tight BP control - monitor   7. Pseudophakia OU  - s/p CE/IOL w/ Dr. Zenaida Niece 2024  - IOL in good position, doing well  - monitor   Ophthalmic Meds Ordered this visit:  No orders of the defined types were placed in this encounter.    Return in about 6 months (around 05/14/2024) for f/u Nevus OS, DFE, OCT, Optos color photos.  There are no Patient Instructions on file for this visit.  Explained the diagnoses, plan, and follow up with the patient and they expressed understanding.  Patient expressed understanding of the importance of proper follow up care.   This document serves as a record of services personally performed by Karie Chimera, MD, PhD. It was created on their behalf by Glee Arvin. Manson Passey, OA an ophthalmic technician. The creation of this record is the provider's dictation and/or activities during the visit.    Electronically signed by: Glee Arvin. Manson Passey, OA 11/15/23 4:30 PM  This document serves as a record of services personally performed by Karie Chimera, MD, PhD. It was created on their behalf by Charlette Caffey, COT an ophthalmic technician. The creation of this record is the provider's dictation and/or  activities during the visit.    Electronically signed by:  Charlette Caffey, COT  11/15/23 4:30 PM  Karie Chimera, M.D., Ph.D. Diseases & Surgery of the Retina and Vitreous [facility] 11/15/2023  I have reviewed the above documentation for accuracy and completeness, and I agree with the above. Karie Chimera, M.D., Ph.D. 11/15/23 4:36 PM   Abbreviations: M myopia (nearsighted); A astigmatism; H hyperopia (farsighted); P presbyopia; Mrx spectacle prescription;  CTL contact lenses; OD right eye; OS left eye; OU both eyes  XT exotropia; ET esotropia; PEK punctate epithelial keratitis; PEE punctate epithelial erosions; DES dry eye syndrome; MGD meibomian gland dysfunction; ATs artificial tears; PFAT's preservative free artificial tears; NSC nuclear sclerotic cataract; PSC posterior subcapsular cataract; ERM epi-retinal membrane; PVD posterior vitreous detachment; RD retinal detachment; DM diabetes mellitus; DR diabetic retinopathy; NPDR non-proliferative diabetic retinopathy; PDR proliferative diabetic retinopathy; CSME clinically significant macular edema; DME diabetic macular edema; dbh dot blot hemorrhages; CWS cotton wool spot; POAG primary open angle glaucoma; C/D cup-to-disc ratio; HVF humphrey visual field; GVF  goldmann visual field; OCT optical coherence tomography; IOP intraocular pressure; BRVO Branch retinal vein occlusion; CRVO central retinal vein occlusion; CRAO central retinal artery occlusion; BRAO branch retinal artery occlusion; RT retinal tear; SB scleral buckle; PPV pars plana vitrectomy; VH Vitreous hemorrhage; PRP panretinal laser photocoagulation; IVK intravitreal kenalog; VMT vitreomacular traction; MH Macular hole;  NVD neovascularization of the disc; NVE neovascularization elsewhere; AREDS age related eye disease study; ARMD age related macular degeneration; POAG primary open angle glaucoma; EBMD epithelial/anterior basement membrane dystrophy; ACIOL  anterior chamber intraocular lens; IOL intraocular lens; PCIOL posterior chamber intraocular lens; Phaco/IOL phacoemulsification with intraocular lens placement; PRK photorefractive keratectomy; LASIK laser assisted in situ keratomileusis; HTN hypertension; DM diabetes mellitus; COPD chronic obstructive pulmonary disease

## 2023-11-15 ENCOUNTER — Encounter (INDEPENDENT_AMBULATORY_CARE_PROVIDER_SITE_OTHER): Payer: Self-pay | Admitting: Ophthalmology

## 2023-11-15 ENCOUNTER — Ambulatory Visit (INDEPENDENT_AMBULATORY_CARE_PROVIDER_SITE_OTHER): Payer: Medicare (Managed Care) | Admitting: Ophthalmology

## 2023-11-15 DIAGNOSIS — E119 Type 2 diabetes mellitus without complications: Secondary | ICD-10-CM

## 2023-11-15 DIAGNOSIS — H3581 Retinal edema: Secondary | ICD-10-CM

## 2023-11-15 DIAGNOSIS — I10 Essential (primary) hypertension: Secondary | ICD-10-CM | POA: Diagnosis not present

## 2023-11-15 DIAGNOSIS — H35033 Hypertensive retinopathy, bilateral: Secondary | ICD-10-CM

## 2023-11-15 DIAGNOSIS — H318 Other specified disorders of choroid: Secondary | ICD-10-CM | POA: Diagnosis not present

## 2023-11-15 DIAGNOSIS — Z7985 Long-term (current) use of injectable non-insulin antidiabetic drugs: Secondary | ICD-10-CM

## 2023-11-15 DIAGNOSIS — Z7984 Long term (current) use of oral hypoglycemic drugs: Secondary | ICD-10-CM

## 2023-11-15 DIAGNOSIS — Z961 Presence of intraocular lens: Secondary | ICD-10-CM

## 2023-12-21 ENCOUNTER — Ambulatory Visit: Payer: Medicare (Managed Care) | Attending: Cardiology | Admitting: Cardiology

## 2023-12-21 VITALS — BP 130/68 | HR 56 | Resp 16 | Ht 72.0 in | Wt 186.0 lb

## 2023-12-21 DIAGNOSIS — E1165 Type 2 diabetes mellitus with hyperglycemia: Secondary | ICD-10-CM | POA: Diagnosis not present

## 2023-12-21 DIAGNOSIS — I10 Essential (primary) hypertension: Secondary | ICD-10-CM | POA: Diagnosis not present

## 2023-12-21 DIAGNOSIS — I483 Typical atrial flutter: Secondary | ICD-10-CM

## 2023-12-21 NOTE — Patient Instructions (Signed)

## 2023-12-21 NOTE — Progress Notes (Signed)
 Cardiology Office Note:  .    ID:  Jordan Sharp 1956/10/22, MRN [id number] PCP:  Darrow Bussing, MD  Former Cardiology Providers: NA [facility] Providers Cardiologist:  Tessa Lerner, DO, [facility] (established care 11/09/2021)  Electrophysiologist:  Lewayne Bunting, MD  Electrophysiologist:  Lewayne Bunting, MD  Click to update primary MD,subspecialty MD or APP then REFRESH:1}    Chief Complaint  Patient presents with   Follow-up    1 year-atrial flutter management    History of Present Illness: Jordan Sharp   ORANGE Sharp is a 68 y.o. Caucasian male whose past medical history and cardiovascular risk factors includes: Non-insulin-dependent diabetes mellitus type 2, hypertension, typical atrial flutter s/p ablation w/ Dr. Ladona Ridgel, hypercholesterolemia.   Patient was referred to the practice back in January 2023 for management of atrial flutter.  He underwent TEE guided cardioversion in February 2023 and unfortunately reverted back to atrial flutter within a short period of time.  He was referred to EP and underwent atrial flutter ablation in January 2024.  He presents today for 1 year follow-up visit.  Since last office visit he denies any anginal chest pain or heart failure symptoms.  He remains in sinus rhythm.  EP has recommended discontinuation of oral anticoagulation after his atrial flutter ablation. He is walking approximately 3.5 miles 5 to 6 days a week.  No change in overall physical endurance.  Along with lifestyle changes and pharmacological therapy patient has lost an additional 11 pounds since last office visit.  Review of Systems: .   Review of Systems  Constitutional: Positive for weight loss.  Cardiovascular:  Negative for chest pain, dyspnea on exertion, leg swelling, orthopnea, palpitations, paroxysmal nocturnal dyspnea and syncope.  Respiratory:  Negative for shortness of breath.     Studies Reviewed:   EKG: EKG Interpretation Date/Time:  Thursday December 21 2023 09:13:44 EST Ventricular Rate:  57 PR Interval:  182 QRS Duration:  84 QT Interval:  406 QTC Calculation: 395 R Axis:   45  Text Interpretation: Sinus bradycardia When compared with ECG of 30-Nov-2021 08:28, Vent. rate has decreased BY  44 BPM Nonspecific T wave abnormality no longer evident in Anterior leads QT has shortened Confirmed by Tessa Lerner [phone]) on 12/21/2023 9:19:00 AM  Echocardiogram: TEE 11/30/2021:  1. Left ventricular ejection fraction, by estimation, is 60 to 65%. The  left ventricle has normal function. The left ventricle has no regional  wall motion abnormalities. Left ventricular diastolic function could not  be evaluated.   2. Right ventricular systolic function is normal. The right ventricular  size is normal.   3. No left atrial/left atrial appendage thrombus was detected. The LAA  emptying velocity was 47 cm/s.   4. The pericardial effusion is anterior to the right ventricle.   5. The mitral valve is normal in structure. Mild mitral valve  regurgitation. No evidence of mitral stenosis.   6. The aortic valve is tricuspid. Aortic valve regurgitation is not  visualized. No aortic stenosis is present.   7. Agitated saline contrast bubble study was negative, with no evidence  of any interatrial shunt.  RADIOLOGY: NA  Risk Assessment/Calculations:   NA  Labs:       Latest Ref Rng & Units 11/29/2021    8:07 AM  CBC  WBC 3.4 - 10.8 x10E3/uL 5.4   Hemoglobin 13.0 - 17.7 g/dL 43.3   Hematocrit 29.5 - 51.0 % 43.3   Platelets 150 - 450 x10E3/uL 228  Latest Ref Rng & Units 11/29/2021    8:07 AM  BMP  Glucose 70 - 99 mg/dL 846   BUN 8 - 27 mg/dL 15   Creatinine 9.62 - 1.27 mg/dL 9.52   BUN/Creat Ratio 10 - 24 13   Sodium 134 - 144 mmol/L 142   Potassium 3.5 - 5.2 mmol/L 4.3   Chloride 96 - 106 mmol/L 104   CO2 20 - 29 mmol/L 21   Calcium 8.6 - 10.2 mg/dL 9.3       Latest Ref Rng & Units 11/29/2021    8:07 AM  CMP  Glucose 70 - 99 mg/dL  841   BUN 8 - 27 mg/dL 15   Creatinine 3.24 - 1.27 mg/dL 4.01   Sodium 027 - 253 mmol/L 142   Potassium 3.5 - 5.2 mmol/L 4.3   Chloride 96 - 106 mmol/L 104   CO2 20 - 29 mmol/L 21   Calcium 8.6 - 10.2 mg/dL 9.3     No results found for: "CHOL", "HDL", "LDLCALC", "LDLDIRECT", "TRIG", "CHOLHDL" No results for input(s): "LIPOA" in the last 8760 hours. No components found for: "NTPROBNP" No results for input(s): "PROBNP" in the last 8760 hours. No results for input(s): "TSH" in the last 8760 hours.  External Labs: Collected: 11/02/2022. TSH 1.54. Total cholesterol 141, triglycerides 80, NHDL 81, LDL calculated 66, HDL 60 BUN 20, creatinine 1. Sodium 140, potassium 4.1, chloride 105, bicarb 28. AST 16, ALT 17, alkaline phosphatase 45  External Labs: Collected: 11/15/2023 [facility] database. Total cholesterol 157, triglycerides 56, HDL 73, LDL 73, A1c 6.3. Hemoglobin 15. Potassium 4.1. TSH 1.54  Physical Exam:    Today's Vitals   12/21/23 0911  BP: 130/68  Pulse: (!) 56  Resp: 16  SpO2: 99%  Weight: 186 lb (84.4 kg)  Height: 6' (1.829 m)   Body mass index is 25.23 kg/m. Wt Readings from Last 3 Encounters:  12/21/23 186 lb (84.4 kg)  12/21/22 196 lb 12.8 oz (89.3 kg)  12/20/22 197 lb 6.4 oz (89.5 kg)    Physical Exam  Constitutional: No distress.  Age appropriate, hemodynamically stable.   Neck: No JVD present.  Cardiovascular: Regular rhythm, S1 normal, S2 normal, intact distal pulses and normal pulses. Bradycardia present. Exam reveals no gallop, no S3 and no S4.  No murmur heard. Pulmonary/Chest: Effort normal and breath sounds normal. No stridor. He has no wheezes. He has no rales.  Abdominal: Soft. Bowel sounds are normal. He exhibits no distension. There is no abdominal tenderness.  Musculoskeletal:        General: No edema.     Cervical back: Neck supple.  Neurological: He is alert and oriented to person, place, and time. He has intact cranial nerves (2-12).   Skin: Skin is warm and moist.   Impression & Recommendation(s):  Impression:   ICD-10-CM   1. Typical atrial flutter (HCC)  I48.3 EKG 12-Lead    2. Type 2 diabetes mellitus with hyperglycemia, without long-term current use of insulin (HCC)  E11.65     3. Benign hypertension  I10        Recommendation(s):  Typical atrial flutter (HCC) Status post ablation 12//2023. Rates are well-controlled. EKG today illustrates sinus bradycardia. No longer on anticoagulation status post ablation-see EP notes. Continue to monitor.  Type 2 diabetes mellitus with hyperglycemia, without long-term current use of insulin (HCC) Most recent hemoglobin A1c 6.3 as of November 15, 2023. Antiglycemic agents currently being managed by PCP. Remains on Lipitor 20 mg  p.o. every afternoon. Most recent labs from [facility] database 11/15/2023 illustrates an LDL level of 73 mg/dL and triglyceride levels of 56 mg/dL.  Continue current medical management  Benign hypertension Office blood pressures are well-controlled. Continue losartan 25 mg p.o. daily. Reemphasized importance of low-salt diet.   Orders Placed:  Orders Placed This Encounter  Procedures   EKG 12-Lead    Final Medication List:   No orders of the defined types were placed in this encounter.   Medications Discontinued During This Encounter  Medication Reason   Probiotic Product (PROBIOTIC PO) Patient Preference   metoprolol tartrate (LOPRESSOR) 50 MG tablet Patient Preference   ELIQUIS 5 MG TABS tablet Patient Preference     Current Outpatient Medications:    acetaminophen (TYLENOL) 650 MG CR tablet, Take 650 mg by mouth daily as needed for pain., Disp: , Rfl:    atorvastatin (LIPITOR) 20 MG tablet, Take 20 mg by mouth every evening., Disp: , Rfl:    Cholecalciferol (DIALYVITE VITAMIN D 5000) 125 MCG (5000 UT) capsule, Take 5,000 Units by mouth daily., Disp: , Rfl:    Coenzyme Q10 (CO Q 10) 100 MG CAPS, Take 100 mg by mouth every evening.,  Disp: , Rfl:    Ginger, Zingiber officinalis, (GINGER ROOT PO), Take 540 mg by mouth 2 (two) times daily., Disp: , Rfl:    losartan (COZAAR) 25 MG tablet, Take 25 mg by mouth daily., Disp: , Rfl:    MELATONIN PO, Take 0.5 mg by mouth at bedtime as needed (sleep)., Disp: , Rfl:    metFORMIN (GLUCOPHAGE-XR) 500 MG 24 hr tablet, Take 500 mg by mouth every evening., Disp: , Rfl:    Multiple Vitamins-Minerals (MULTIVITAMIN MEN PO), Take 1 tablet by mouth in the morning., Disp: , Rfl:    Omega-3 Fatty Acids (ULTRA OMEGA-3 FISH OIL) 1400 MG CAPS, Take 1,400 mg by mouth every evening., Disp: , Rfl:    OZEMPIC, 0.25 OR 0.5 MG/DOSE, 2 MG/1.5ML SOPN, Inject 0.5 mg into the skin every Thursday., Disp: , Rfl:   Consent:   NA  Disposition:   1 year follow-up sooner if needed  His questions and concerns were addressed to his satisfaction. He voices understanding of the recommendations provided during this encounter.    Signed, Tessa Lerner, DO, [facility] [place]  [facility]  [address] [place], [place] [address]

## 2023-12-24 ENCOUNTER — Encounter: Payer: Self-pay | Admitting: Cardiology

## 2024-02-20 DIAGNOSIS — D487 Neoplasm of uncertain behavior of other specified sites: Secondary | ICD-10-CM | POA: Diagnosis not present

## 2024-04-05 NOTE — Progress Notes (Signed)
   04/05/2024  Patient ID: Jordan Sharp, male   DOB: 09-20-1956, 68 y.o.   MRN: [id number]  Reviewed patient regarding medication adherence from a quality report for [name] MD.    Per [facility] and payer portal fill history: Ozempic 0.5 mg - last filled 03/26/24 for a 28-day supply. Metformin XR 500 mg - last filled 02/06/24 for a 90-day supply. Atorvastatin 20 mg - last filled 02/20/24 for an 90-day supply. Losartan 25 mg - last filled 01/10/24 for a 90-day supply.   I will follow up with the patient for medication assistance screening for Ozempic and adherence monitoring.  Livia Riffle, PharmD Clinical Pharmacist  [phone]

## 2024-04-15 ENCOUNTER — Telehealth: Payer: Self-pay

## 2024-04-15 NOTE — Progress Notes (Signed)
   04/15/2024  Patient ID: Jordan Sharp, male   DOB: 1956-04-23, 67 y.o.   MRN: [id number]  Reviewed patient regarding medication adherence from a quality report for Almarie Scala MD.    Per [facility] and payer portal fill history: Losartan 25 mg - last filled 04/09/24 for a 90-day supply.  Ozempic 0.5 mg - last filled 03/26/24 for a 28-day supply. Atorvastatin 20 mg - last filled 02/20/24 for a 90-day supply.    Spoke to patient today, screened for medication assistance for Ozempic. Patient declined at this time, as he reports his copay is affordable. He also reports a new PCP so we will submit for reattribution.   Heather Factor, PharmD Clinical Pharmacist  [phone]

## 2024-04-22 DIAGNOSIS — I483 Typical atrial flutter: Secondary | ICD-10-CM | POA: Diagnosis not present

## 2024-04-22 DIAGNOSIS — I10 Essential (primary) hypertension: Secondary | ICD-10-CM | POA: Diagnosis not present

## 2024-04-22 DIAGNOSIS — E119 Type 2 diabetes mellitus without complications: Secondary | ICD-10-CM | POA: Diagnosis not present

## 2024-04-22 DIAGNOSIS — E7800 Pure hypercholesterolemia, unspecified: Secondary | ICD-10-CM | POA: Diagnosis not present

## 2024-05-15 ENCOUNTER — Encounter (INDEPENDENT_AMBULATORY_CARE_PROVIDER_SITE_OTHER): Payer: Medicare (Managed Care) | Admitting: Ophthalmology

## 2024-05-15 DIAGNOSIS — E119 Type 2 diabetes mellitus without complications: Secondary | ICD-10-CM | POA: Diagnosis not present

## 2024-05-16 DIAGNOSIS — D225 Melanocytic nevi of trunk: Secondary | ICD-10-CM | POA: Diagnosis not present

## 2024-05-23 DIAGNOSIS — Z79899 Other long term (current) drug therapy: Secondary | ICD-10-CM | POA: Diagnosis not present

## 2024-05-23 DIAGNOSIS — E7800 Pure hypercholesterolemia, unspecified: Secondary | ICD-10-CM | POA: Diagnosis not present

## 2024-05-23 DIAGNOSIS — I10 Essential (primary) hypertension: Secondary | ICD-10-CM | POA: Diagnosis not present

## 2024-05-23 DIAGNOSIS — E119 Type 2 diabetes mellitus without complications: Secondary | ICD-10-CM | POA: Diagnosis not present

## 2024-05-23 DIAGNOSIS — I483 Typical atrial flutter: Secondary | ICD-10-CM | POA: Diagnosis not present

## 2024-11-05 ENCOUNTER — Other Ambulatory Visit (HOSPITAL_BASED_OUTPATIENT_CLINIC_OR_DEPARTMENT_OTHER): Payer: Self-pay

## 2024-11-06 ENCOUNTER — Other Ambulatory Visit (HOSPITAL_BASED_OUTPATIENT_CLINIC_OR_DEPARTMENT_OTHER): Payer: Self-pay

## 2024-11-06 MED ORDER — OZEMPIC (0.25 OR 0.5 MG/DOSE) 2 MG/3ML ~~LOC~~ SOPN
0.5000 mg | PEN_INJECTOR | SUBCUTANEOUS | 11 refills | Status: AC
  Filled 2024-11-06: qty 3, 28d supply, fill #0
  Filled 2024-11-28: qty 3, 28d supply, fill #1

## 2024-11-07 ENCOUNTER — Other Ambulatory Visit (HOSPITAL_BASED_OUTPATIENT_CLINIC_OR_DEPARTMENT_OTHER): Payer: Self-pay

## 2024-11-26 ENCOUNTER — Other Ambulatory Visit (HOSPITAL_BASED_OUTPATIENT_CLINIC_OR_DEPARTMENT_OTHER): Payer: Self-pay

## 2024-11-26 MED ORDER — ATORVASTATIN CALCIUM 20 MG PO TABS
20.0000 mg | ORAL_TABLET | Freq: Every day | ORAL | 3 refills | Status: AC
  Filled 2024-11-26: qty 90, 90d supply, fill #0

## 2024-11-28 ENCOUNTER — Other Ambulatory Visit (HOSPITAL_BASED_OUTPATIENT_CLINIC_OR_DEPARTMENT_OTHER): Payer: Self-pay

## 2025-01-01 ENCOUNTER — Ambulatory Visit: Payer: Medicare (Managed Care) | Admitting: Cardiology
